# Patient Record
Sex: Female | Born: 1965
Health system: Southern US, Community
[De-identification: ages and names within clinical notes are randomized; demographics above are authoritative.]

## PROBLEM LIST (undated history)

## (undated) DIAGNOSIS — M1990 Unspecified osteoarthritis, unspecified site: Secondary | ICD-10-CM

## (undated) DIAGNOSIS — B019 Varicella without complication: Secondary | ICD-10-CM

## (undated) DIAGNOSIS — K219 Gastro-esophageal reflux disease without esophagitis: Secondary | ICD-10-CM

## (undated) DIAGNOSIS — E559 Vitamin D deficiency, unspecified: Secondary | ICD-10-CM

## (undated) HISTORY — DX: Varicella without complication: B01.9

## (undated) HISTORY — PX: ABDOMINAL HYSTERECTOMY: SHX81

## (undated) HISTORY — DX: Gastro-esophageal reflux disease without esophagitis: K21.9

## (undated) HISTORY — DX: Vitamin D deficiency, unspecified: E55.9

## (undated) HISTORY — PX: TUBAL LIGATION: SHX77

---

## 2004-10-29 ENCOUNTER — Ambulatory Visit: Payer: Self-pay | Admitting: General Practice

## 2005-03-13 ENCOUNTER — Ambulatory Visit: Payer: Self-pay | Admitting: Family Medicine

## 2005-12-03 ENCOUNTER — Ambulatory Visit: Payer: Self-pay | Admitting: General Practice

## 2007-02-02 ENCOUNTER — Ambulatory Visit: Payer: Self-pay | Admitting: Family Medicine

## 2009-01-02 ENCOUNTER — Ambulatory Visit: Payer: Self-pay | Admitting: Family Medicine

## 2009-10-28 ENCOUNTER — Emergency Department (HOSPITAL_COMMUNITY): Admission: EM | Admit: 2009-10-28 | Discharge: 2009-10-28 | Payer: Self-pay | Admitting: Emergency Medicine

## 2010-03-17 ENCOUNTER — Ambulatory Visit: Payer: Self-pay | Admitting: Family Medicine

## 2010-08-04 LAB — POCT PREGNANCY, URINE: Preg Test, Ur: NEGATIVE

## 2010-08-04 LAB — URINALYSIS, ROUTINE W REFLEX MICROSCOPIC
Nitrite: NEGATIVE
Protein, ur: NEGATIVE mg/dL
Urobilinogen, UA: 0.2 mg/dL (ref 0.0–1.0)

## 2011-04-28 ENCOUNTER — Ambulatory Visit: Payer: Self-pay | Admitting: Family Medicine

## 2013-06-26 ENCOUNTER — Ambulatory Visit: Payer: Self-pay | Admitting: Adult Health

## 2013-07-10 ENCOUNTER — Encounter: Payer: Self-pay | Admitting: Adult Health

## 2013-07-10 ENCOUNTER — Encounter (INDEPENDENT_AMBULATORY_CARE_PROVIDER_SITE_OTHER): Payer: Self-pay

## 2013-07-10 ENCOUNTER — Ambulatory Visit (INDEPENDENT_AMBULATORY_CARE_PROVIDER_SITE_OTHER): Payer: BC Managed Care – PPO | Admitting: Adult Health

## 2013-07-10 VITALS — BP 110/72 | HR 82 | Temp 98.3°F | Resp 14 | Ht 63.0 in | Wt 171.0 lb

## 2013-07-10 DIAGNOSIS — Z0000 Encounter for general adult medical examination without abnormal findings: Secondary | ICD-10-CM

## 2013-07-10 DIAGNOSIS — E559 Vitamin D deficiency, unspecified: Secondary | ICD-10-CM

## 2013-07-10 DIAGNOSIS — K5900 Constipation, unspecified: Secondary | ICD-10-CM

## 2013-07-10 DIAGNOSIS — Z1211 Encounter for screening for malignant neoplasm of colon: Secondary | ICD-10-CM

## 2013-07-10 DIAGNOSIS — Z1239 Encounter for other screening for malignant neoplasm of breast: Secondary | ICD-10-CM

## 2013-07-10 NOTE — Progress Notes (Signed)
Pre visit review using our clinic review tool, if applicable. No additional management support is needed unless otherwise documented below in the visit note. 

## 2013-07-10 NOTE — Patient Instructions (Signed)
   Thank you for choosing [place] at [facility] for your health care needs.  Please have your labs drawn at your earliest convenience. You will need to fast after midnight on the night before having your labs. You may drink water.  The results will be available through MyChart for your convenience. Please remember to activate this. The activation code is located at the end of this form.  I am referring you to Dr. Vira Agar for your screening colonoscopy. Their office will call you with an appointment.   Please call to schedule your Mammogram at [facility]. I have provided you with an order in the event you need it.  For constipation start Miralax daily. You can adjust the dose based on the frequency and consistency of your bowel movements.  I am requesting your records from Dr. Barbarann Ehlers office at [facility].

## 2013-07-10 NOTE — Progress Notes (Signed)
Patient ID: Pamela Nichols, female   DOB: Oct 09, 1965, 48 y.o.   MRN: [id number]    Subjective:    Patient ID: Pamela Nichols, female    DOB: [birth year], 48 y.o.   MRN: [id number]  HPI  Pt is a 48 y/o female who presents to clinic to establish care. She was previously followed by Dr. Kary Nichols at [facility]. Will request records. She is feeling well today. Has not had a physical exam in over 2 years. She is up to date on her Mammogram and PAP. She will need a screening colonoscopy this year as she turns 32 in April.    Past Medical History  Diagnosis Date  . Chicken pox   . Vitamin D deficiency      History reviewed. No pertinent past surgical history.   Family History  Problem Relation Age of Onset  . Hypertension Mother   . Mental illness Mother     Depression  . Cancer Father     prostate      History   Social History  . Marital Status: Married    Spouse Name: N/A    Number of Children: 2  . Years of Education: 14   Occupational History  . Hair Stylist    Social History Main Topics  . Smoking status: Never Smoker   . Smokeless tobacco: Not on file  . Alcohol Use: No  . Drug Use: No  . Sexual Activity: Not on file   Other Topics Concern  . Not on file   Social History Narrative   Pamela Nichols grew up in [place]. She is married and lives in [place] with her husband. She works as a Probation officer. She and her husband are raising their nephew that is 37 years old. She has 2 adult children from a previous marriage. Her children live in [place]. She enjoys being outdoors. She also enjoys relaxing and watching TV.     Review of Systems  Constitutional: Negative.   HENT: Negative.   Eyes: Negative.   Respiratory: Negative.   Cardiovascular: Negative.   Gastrointestinal: Negative.   Endocrine: Negative.   Genitourinary: Negative.   Musculoskeletal: Negative.   Skin: Negative.   Allergic/Immunologic: Negative.   Neurological: Negative.     Hematological: Negative.   Psychiatric/Behavioral: Negative.        Objective:  There were no vitals taken for this visit.   Physical Exam  Constitutional: She is oriented to person, place, and time. She appears well-developed and well-nourished. No distress.  HENT:  Head: Normocephalic and atraumatic.  Right Ear: External ear normal.  Left Ear: External ear normal.  Nose: Nose normal.  Mouth/Throat: Oropharynx is clear and moist.  Eyes: Conjunctivae and EOM are normal. Pupils are equal, round, and reactive to light.  Neck: Normal range of motion. Neck supple. No tracheal deviation present. No thyromegaly present.  Cardiovascular: Normal rate, regular rhythm, normal heart sounds and intact distal pulses.  Exam reveals no gallop and no friction rub.   No murmur heard. Pulmonary/Chest: Effort normal and breath sounds normal. No respiratory distress. She has no wheezes. She has no rales.  Abdominal: Soft. Bowel sounds are normal. She exhibits no distension and no mass. There is no tenderness. There is no rebound and no guarding.  Musculoskeletal: Normal range of motion. She exhibits no edema and no tenderness.  Lymphadenopathy:    She has no cervical adenopathy.  Neurological: She is alert and oriented to person, place, and time. She has normal reflexes. No  cranial nerve deficit. Coordination normal.  Skin: Skin is warm and dry.  Psychiatric: She has a normal mood and affect. Her behavior is normal. Judgment and thought content normal.       Assessment & Plan:   1. Routine general medical examination at a health care facility Normal physical exam excluding breast, pelvic/PAP which were recently done. Will order screening mammogram. Pt will need screening colonoscopy this year. Check labs. Request records from previous PCP.  - TSH; Future - CBC with Differential; Future - Lipid panel; Future - Vitamin B12; Future - Comprehensive metabolic panel; Future  2. Vitamin D  deficiency Hx of vitamin D deficiency previously on Drisdol. She reports she has not had this rechecked and she is currently not taking any medications.  - Vit D  25 hydroxy (rtn osteoporosis monitoring); Future  3. Screening for breast cancer Ordered screening Mammogram. She will call [facility] to self-schedule.  - MM DIGITAL SCREENING BILATERAL; Future  4. Screening for colon cancer Refer to Dr. Vira Nichols from screening colonoscopy.  - Ambulatory referral to Gastroenterology  5. Constipation Has problems with constipations. Sometimes goes days without adequate bm. Start miralax daily. Adjust dose as needed based on frequency and consistency of BMs.

## 2013-07-17 ENCOUNTER — Other Ambulatory Visit: Payer: BC Managed Care – PPO

## 2013-07-18 ENCOUNTER — Other Ambulatory Visit: Payer: Self-pay | Admitting: Adult Health

## 2013-07-18 ENCOUNTER — Other Ambulatory Visit (INDEPENDENT_AMBULATORY_CARE_PROVIDER_SITE_OTHER): Payer: BC Managed Care – PPO

## 2013-07-18 DIAGNOSIS — Z0000 Encounter for general adult medical examination without abnormal findings: Secondary | ICD-10-CM

## 2013-07-18 DIAGNOSIS — D649 Anemia, unspecified: Secondary | ICD-10-CM

## 2013-07-18 DIAGNOSIS — E559 Vitamin D deficiency, unspecified: Secondary | ICD-10-CM

## 2013-07-18 LAB — CBC WITH DIFFERENTIAL/PLATELET
BASOS ABS: 0 10*3/uL (ref 0.0–0.1)
BASOS PCT: 0.1 % (ref 0.0–3.0)
EOS PCT: 2.7 % (ref 0.0–5.0)
Eosinophils Absolute: 0.1 10*3/uL (ref 0.0–0.7)
HEMATOCRIT: 29.9 % — AB (ref 36.0–46.0)
HEMOGLOBIN: 9.4 g/dL — AB (ref 12.0–15.0)
LYMPHS PCT: 47.9 % — AB (ref 12.0–46.0)
Lymphs Abs: 2 10*3/uL (ref 0.7–4.0)
MCHC: 31.4 g/dL (ref 30.0–36.0)
MCV: 76.1 fl — ABNORMAL LOW (ref 78.0–100.0)
MONOS PCT: 11.7 % (ref 3.0–12.0)
Monocytes Absolute: 0.5 10*3/uL (ref 0.1–1.0)
NEUTROS ABS: 1.6 10*3/uL (ref 1.4–7.7)
Neutrophils Relative %: 37.6 % — ABNORMAL LOW (ref 43.0–77.0)
Platelets: 245 10*3/uL (ref 150.0–400.0)
RBC: 3.93 Mil/uL (ref 3.87–5.11)
RDW: 20.3 % — AB (ref 11.5–14.6)
WBC: 4.2 10*3/uL — AB (ref 4.5–10.5)

## 2013-07-18 LAB — COMPREHENSIVE METABOLIC PANEL
ALK PHOS: 53 U/L (ref 39–117)
ALT: 11 U/L (ref 0–35)
AST: 18 U/L (ref 0–37)
Albumin: 3.6 g/dL (ref 3.5–5.2)
BILIRUBIN TOTAL: 0.9 mg/dL (ref 0.3–1.2)
BUN: 8 mg/dL (ref 6–23)
CO2: 25 meq/L (ref 19–32)
CREATININE: 0.6 mg/dL (ref 0.4–1.2)
Calcium: 8.5 mg/dL (ref 8.4–10.5)
Chloride: 107 mEq/L (ref 96–112)
GFR: 131.08 mL/min (ref >60.00)
Glucose, Bld: 81 mg/dL (ref 70–99)
Potassium: 3.8 mEq/L (ref 3.5–5.1)
Sodium: 136 mEq/L (ref 135–145)
Total Protein: 7 g/dL (ref 6.0–8.3)

## 2013-07-18 LAB — LIPID PANEL
Cholesterol: 167 mg/dL (ref 0–200)
HDL: 62.8 mg/dL (ref >39.00)
LDL CALC: 92 mg/dL (ref 0–99)
Total CHOL/HDL Ratio: 3
Triglycerides: 62 mg/dL (ref 0.0–149.0)
VLDL: 12.4 mg/dL (ref 0.0–40.0)

## 2013-07-18 LAB — TSH: TSH: 1.17 u[IU]/mL (ref 0.35–5.50)

## 2013-07-18 LAB — VITAMIN B12: VITAMIN B 12: 520 pg/mL (ref 211–911)

## 2013-07-19 LAB — VITAMIN D 25 HYDROXY (VIT D DEFICIENCY, FRACTURES): VIT D 25 HYDROXY: 26 ng/mL — AB (ref 30–89)

## 2013-07-20 ENCOUNTER — Telehealth: Payer: Self-pay | Admitting: *Deleted

## 2013-07-20 NOTE — Telephone Encounter (Signed)
Message copied by Vernetta Honey on Thu Jul 20, 2013  1:20 PM ------      Message from: Sherryl Barters      Created: Tue Jul 18, 2013  1:51 PM       Labs show anemia. I want her to come in for additional blood work to check her iron level ------

## 2013-07-20 NOTE — Telephone Encounter (Signed)
Advised pt of [facility] note "Labs show anemia. I want her to come in for additional blood work to check her iron level" .  Pt agreed and is making a lab appt

## 2013-07-24 ENCOUNTER — Ambulatory Visit: Payer: Self-pay | Admitting: Adult Health

## 2013-07-24 ENCOUNTER — Other Ambulatory Visit (INDEPENDENT_AMBULATORY_CARE_PROVIDER_SITE_OTHER): Payer: BC Managed Care – PPO

## 2013-07-24 DIAGNOSIS — D649 Anemia, unspecified: Secondary | ICD-10-CM

## 2013-07-24 LAB — FERRITIN: FERRITIN: 4.8 ng/mL — AB (ref 10.0–291.0)

## 2013-07-24 LAB — FOLATE: FOLATE: 9.5 ng/mL (ref >5.9)

## 2013-07-24 LAB — IRON: Iron: 12 ug/dL — ABNORMAL LOW (ref 42–145)

## 2013-07-25 ENCOUNTER — Other Ambulatory Visit: Payer: BC Managed Care – PPO

## 2013-07-25 ENCOUNTER — Other Ambulatory Visit: Payer: Self-pay | Admitting: Adult Health

## 2013-07-25 MED ORDER — FERROUS SULFATE 325 (65 FE) MG PO TABS: 325.0000 mg | ORAL_TABLET | Freq: Two times a day (BID) | ORAL | Status: DC

## 2013-07-25 NOTE — Progress Notes (Signed)
Notified pt. 

## 2013-08-11 ENCOUNTER — Encounter: Payer: Self-pay | Admitting: Adult Health

## 2013-09-25 ENCOUNTER — Emergency Department (HOSPITAL_COMMUNITY): Payer: BC Managed Care – PPO

## 2013-09-25 ENCOUNTER — Encounter (HOSPITAL_COMMUNITY): Payer: Self-pay | Admitting: Emergency Medicine

## 2013-09-25 ENCOUNTER — Emergency Department (HOSPITAL_COMMUNITY)
Admission: EM | Admit: 2013-09-25 | Discharge: 2013-09-25 | Disposition: A | Discharge status: Home / Self Care | Payer: BC Managed Care – PPO | Attending: Emergency Medicine | Admitting: Emergency Medicine

## 2013-09-25 DIAGNOSIS — R1011 Right upper quadrant pain: Secondary | ICD-10-CM | POA: Diagnosis not present

## 2013-09-25 DIAGNOSIS — Z79899 Other long term (current) drug therapy: Secondary | ICD-10-CM | POA: Diagnosis not present

## 2013-09-25 DIAGNOSIS — Z9851 Tubal ligation status: Secondary | ICD-10-CM | POA: Diagnosis not present

## 2013-09-25 DIAGNOSIS — R109 Unspecified abdominal pain: Secondary | ICD-10-CM

## 2013-09-25 DIAGNOSIS — R079 Chest pain, unspecified: Secondary | ICD-10-CM | POA: Diagnosis present

## 2013-09-25 DIAGNOSIS — R110 Nausea: Secondary | ICD-10-CM | POA: Diagnosis not present

## 2013-09-25 DIAGNOSIS — Z8619 Personal history of other infectious and parasitic diseases: Secondary | ICD-10-CM | POA: Insufficient documentation

## 2013-09-25 LAB — CBC WITH DIFFERENTIAL/PLATELET
BASOS ABS: 0 10*3/uL (ref 0.0–0.1)
Basophils Relative: 0 % (ref 0–1)
Eosinophils Absolute: 0.2 10*3/uL (ref 0.0–0.7)
Eosinophils Relative: 3 % (ref 0–5)
HCT: 33.3 % — ABNORMAL LOW (ref 36.0–46.0)
Hemoglobin: 10.9 g/dL — ABNORMAL LOW (ref 12.0–15.0)
LYMPHS PCT: 37 % (ref 12–46)
Lymphs Abs: 2.3 10*3/uL (ref 0.7–4.0)
MCH: 26.1 pg (ref 26.0–34.0)
MCHC: 32.7 g/dL (ref 30.0–36.0)
MCV: 79.7 fL (ref 78.0–100.0)
Monocytes Absolute: 0.7 10*3/uL (ref 0.1–1.0)
Monocytes Relative: 12 % (ref 3–12)
NEUTROS ABS: 3 10*3/uL (ref 1.7–7.7)
Neutrophils Relative %: 48 % (ref 43–77)
PLATELETS: 253 10*3/uL (ref 150–400)
RBC: 4.18 MIL/uL (ref 3.87–5.11)
RDW: 18.4 % — AB (ref 11.5–15.5)
WBC: 6.2 10*3/uL (ref 4.0–10.5)

## 2013-09-25 LAB — COMPREHENSIVE METABOLIC PANEL
ALT: 12 U/L (ref 0–35)
AST: 15 U/L (ref 0–37)
Albumin: 3.6 g/dL (ref 3.5–5.2)
Alkaline Phosphatase: 68 U/L (ref 39–117)
BILIRUBIN TOTAL: 0.2 mg/dL — AB (ref 0.3–1.2)
BUN: 9 mg/dL (ref 6–23)
CHLORIDE: 102 meq/L (ref 96–112)
CO2: 24 meq/L (ref 19–32)
Calcium: 9.2 mg/dL (ref 8.4–10.5)
Creatinine, Ser: 0.61 mg/dL (ref 0.50–1.10)
GFR calc Af Amer: >90 mL/min (ref >90)
Glucose, Bld: 102 mg/dL — ABNORMAL HIGH (ref 70–99)
POTASSIUM: 3.9 meq/L (ref 3.7–5.3)
SODIUM: 138 meq/L (ref 137–147)
Total Protein: 7.5 g/dL (ref 6.0–8.3)

## 2013-09-25 LAB — LIPASE, BLOOD: Lipase: 86 U/L — ABNORMAL HIGH (ref 11–59)

## 2013-09-25 LAB — TROPONIN I: Troponin I: <0.3 ng/mL (ref <0.30)

## 2013-09-25 MED ORDER — OXYCODONE-ACETAMINOPHEN 5-325 MG PO TABS: 1.0000 | ORAL_TABLET | ORAL | Status: DC | PRN

## 2013-09-25 MED ORDER — MORPHINE SULFATE 4 MG/ML IJ SOLN
4.0000 mg | Freq: Once | INTRAMUSCULAR | Status: AC
  Administered 2013-09-25: 4 mg via INTRAVENOUS
  Filled 2013-09-25: qty 1

## 2013-09-25 MED ORDER — PROMETHAZINE HCL 25 MG PO TABS: 25.0000 mg | ORAL_TABLET | Freq: Four times a day (QID) | ORAL | Status: DC | PRN

## 2013-09-25 MED ORDER — SODIUM CHLORIDE 0.9 % IV BOLUS (SEPSIS)
1000.0000 mL | Freq: Once | INTRAVENOUS | Status: AC
  Administered 2013-09-25: 1000 mL via INTRAVENOUS

## 2013-09-25 MED ORDER — ONDANSETRON HCL 4 MG/2ML IJ SOLN
4.0000 mg | Freq: Once | INTRAMUSCULAR | Status: AC
  Administered 2013-09-25: 4 mg via INTRAVENOUS
  Filled 2013-09-25: qty 2

## 2013-09-25 NOTE — ED Notes (Addendum)
Abd pain  Chest pain, n/v, no diarrhea.  Feels food not going down.   No urinary sx,

## 2013-09-25 NOTE — ED Provider Notes (Signed)
CSN: [id number]     Arrival date & time 09/25/13  1450 History  This chart was scribed for Nat Christen, MD by Jenne Campus, ED Scribe. This patient was seen in room APA11/APA11 and the patient's care was started at 3:35 PM.    Chief Complaint  Patient presents with  . Chest Pain      The history is provided by the patient. No language interpreter was used.    HPI Comments: Pamela Nichols is a 48 y.o. female who presents to the Emergency Department complaining of a new onset of constant RUQ abdominal pain for the past week with associated nausea. Pt reports that he symptoms are made worse with eating and states that it feels as if the "food is not going down". She denies any family h/o cholelithiasis. No radiation of pain. Severity is mild to moderate.  She denies any chronic medical conditions other than Vitamin D deficiency.   Past Medical History  Diagnosis Date  . Chicken pox   . Vitamin D deficiency    Past Surgical History  Procedure Laterality Date  . Tubal ligation     Family History  Problem Relation Age of Onset  . Hypertension Mother   . Mental illness Mother     Depression  . Cancer Father     prostate    History  Substance Use Topics  . Smoking status: Never Smoker   . Smokeless tobacco: Not on file  . Alcohol Use: No   No OB history provided.  Review of Systems  A complete 10 system review of systems was obtained and all systems are negative except as noted in the HPI and PMH.    Allergies  Review of patient's allergies indicates no known allergies.  Home Medications   Prior to Admission medications   Medication Sig Start Date End Date Taking? Authorizing Provider  ferrous sulfate 325 (65 FE) MG tablet Take 1 tablet (325 mg total) by mouth 2 (two) times daily. 07/25/13   Charolette Forward, NP   Triage vitals: BP 129/85  Pulse 75  Temp(Src) 98.7 F (37.1 C) (Oral)  Resp 18  Ht 5\' 2"  (1.575 m)  Wt 171 lb (77.565 kg)  BMI 31.27 kg/m2  SpO2 100%   LMP 09/04/2013  Physical Exam  Nursing note and vitals reviewed. Constitutional: She is oriented to person, place, and time. She appears well-developed and well-nourished.  HENT:  Head: Normocephalic and atraumatic.  Eyes: Conjunctivae and EOM are normal. Pupils are equal, round, and reactive to light.  Neck: Normal range of motion. Neck supple.  Cardiovascular: Normal rate, regular rhythm and normal heart sounds.   Pulmonary/Chest: Effort normal and breath sounds normal.  Abdominal: Soft. Bowel sounds are normal. There is tenderness (moderate RUQ tenderness ).  Musculoskeletal: Normal range of motion.  Neurological: She is alert and oriented to person, place, and time.  Skin: Skin is warm and dry.  Psychiatric: She has a normal mood and affect. Her behavior is normal.    ED Course  Procedures (including critical care time)  DIAGNOSTIC STUDIES: Oxygen Saturation is 100% on RA, normal by my interpretation.    COORDINATION OF CARE: 3:37 PM-Discussed treatment plan which includes CXR, CBC panel, CMP and a troponin with pt at bedside and pt agreed to plan.   Labs Review Labs Reviewed  CBC WITH DIFFERENTIAL - Abnormal; Notable for the following:    Hemoglobin 10.9 (*)    HCT 33.3 (*)    RDW 18.4 (*)  All other components within normal limits  COMPREHENSIVE METABOLIC PANEL - Abnormal; Notable for the following:    Glucose, Bld 102 (*)    Total Bilirubin 0.2 (*)    All other components within normal limits  LIPASE, BLOOD - Abnormal; Notable for the following:    Lipase 86 (*)    All other components within normal limits  TROPONIN I    Imaging Review Dg Chest 2 View  09/25/2013   CLINICAL DATA:  Epigastric pain.  EXAM: CHEST  2 VIEW  COMPARISON:  None.  FINDINGS: The lungs are borderline hypoinflated especially on the frontal film. There is no focal infiltrate. There is no pleural effusion or pneumothorax. The cardiopericardial silhouette is normal in size. The pulmonary  vascularity is not engorged. The mediastinum is normal in width. There is a moderate amount of gas and stool within bowel in the upper abdomen. There is gas within the stomach. The observed portions of the bony thorax exhibit no acute abnormalities.  IMPRESSION: 1. There is no evidence of active cardiopulmonary disease. 2. There is a moderate amount of gas and stool within bowel in the upper abdomen. No free subdiaphragmatic gas collections are demonstrated.   Electronically Signed   By: David  Martinique   On: 09/25/2013 16:07   US Abdomen Limited Ruq  09/25/2013   CLINICAL DATA:  Right upper quadrant discomfort with nausea and vomiting  EXAM: US ABDOMEN LIMITED - RIGHT UPPER QUADRANT  COMPARISON:  None.  FINDINGS: Gallbladder:  No gallstones or wall thickening visualized. No sonographic Murphy sign noted.  Common bile duct:  Diameter: 3.5 mm.  Liver:  No focal lesion identified. Within normal limits in parenchymal echogenicity. No intrahepatic ductal dilation is demonstrated.  IMPRESSION: No acute abnormality of the gallbladder or common bile duct or liver is demonstrated. Bowel gas limits the study somewhat.   Electronically Signed   By: David  Martinique   On: 09/25/2013 17:22     EKG Interpretation   Date/Time:  Monday Sep 25 2013 15:35:29 EDT Ventricular Rate:  77 PR Interval:  134 QRS Duration: 70 QT Interval:  373 QTC Calculation: 422 R Axis:   41 Text Interpretation:  Sinus rhythm Low voltage, precordial leads Confirmed  by Devarion Mcclanahan  MD, Talena Neira ([id number]) on 09/25/2013 4:42:26 PM      MDM   Final diagnoses:  Abdominal pain   No acute abdomen. Lipase is minimally elevated. Ultrasound shows no obvious gallbladder or common bile duct problems.  Discharge medications Percocet and Phenergan 25 mg. Patient will return if worse. I personally performed the services described in this documentation, which was scribed in my presence. The recorded information has been reviewed and is  accurate.      Nat Christen, MD 09/25/13 [phone]

## 2013-09-25 NOTE — Discharge Instructions (Signed)
Abdominal Pain, Adult Many things can cause belly (abdominal) pain. Most times, the belly pain is not dangerous. Many cases of belly pain can be watched and treated at home. HOME CARE   Do not take medicines that help you go poop (laxatives) unless told to by your doctor.  Only take medicine as told by your doctor.  Eat or drink as told by your doctor. Your doctor will tell you if you should be on a special diet. GET HELP IF:  You do not know what is causing your belly pain.  You have belly pain while you are sick to your stomach (nauseous) or have runny poop (diarrhea).  You have pain while you pee or poop.  Your belly pain wakes you up at night.  You have belly pain that gets worse or better when you eat.  You have belly pain that gets worse when you eat fatty foods. GET HELP RIGHT AWAY IF:   The pain does not go away within 2 hours.  You have a fever.  You keep throwing up (vomiting).  The pain changes and is only in the right or left part of the belly.  You have bloody or tarry looking poop. MAKE SURE YOU:   Understand these instructions.  Will watch your condition.  Will get help right away if you are not doing well or get worse. Document Released: 10/21/2007 Document Revised: 02/22/2013 Document Reviewed: 01/11/2013 [facility] Patient Information 2014 [place].  A chemical called lipase is slightly elevated. This is associated with your pancreas. Medication for pain and nausea. Avoid rich greasy foods. Return to emergency Department if feeling worse in any way.

## 2013-09-29 ENCOUNTER — Telehealth: Payer: Self-pay | Admitting: Adult Health

## 2013-10-03 ENCOUNTER — Ambulatory Visit: Payer: BC Managed Care – PPO | Admitting: Adult Health

## 2013-10-16 ENCOUNTER — Ambulatory Visit: Payer: BC Managed Care – PPO | Admitting: Adult Health

## 2013-10-18 ENCOUNTER — Ambulatory Visit: Payer: BC Managed Care – PPO | Admitting: Adult Health

## 2013-10-23 ENCOUNTER — Ambulatory Visit (INDEPENDENT_AMBULATORY_CARE_PROVIDER_SITE_OTHER): Payer: BC Managed Care – PPO | Admitting: Adult Health

## 2013-10-23 ENCOUNTER — Encounter: Payer: Self-pay | Admitting: Adult Health

## 2013-10-23 VITALS — BP 110/76 | HR 90 | Temp 98.0°F | Resp 14 | Wt 175.5 lb

## 2013-10-23 DIAGNOSIS — K5900 Constipation, unspecified: Secondary | ICD-10-CM

## 2013-10-23 DIAGNOSIS — R6889 Other general symptoms and signs: Secondary | ICD-10-CM

## 2013-10-23 DIAGNOSIS — R1013 Epigastric pain: Secondary | ICD-10-CM

## 2013-10-23 DIAGNOSIS — L989 Disorder of the skin and subcutaneous tissue, unspecified: Secondary | ICD-10-CM | POA: Insufficient documentation

## 2013-10-23 DIAGNOSIS — R899 Unspecified abnormal finding in specimens from other organs, systems and tissues: Secondary | ICD-10-CM | POA: Insufficient documentation

## 2013-10-23 MED ORDER — TRIAMCINOLONE ACETONIDE 0.1 % EX CREA: 1.0000 "application " | TOPICAL_CREAM | Freq: Two times a day (BID) | CUTANEOUS | Status: DC

## 2013-10-23 NOTE — Progress Notes (Signed)
Patient ID: Pamela Nichols, female   DOB: 1966/04/15, 48 y.o.   MRN: [id number]   Subjective:    Patient ID: Pamela Nichols, female    DOB: 05-26-1965, 48 y.o.   MRN: [id number]  HPI  Pt is a pleasant 48 y/o female who presents to clinic for follow up ED visit on 09/25/13 for epigastric pain. She reports that she had been drinking Herbal life supplements when she began to experience significant abdominal discomfort. She had abdominal ultrasound which was negative. Chest xray was normal. Lipase was slightly elevated. Pt does not drink. She has been feeling tired and fatigued. Her ferritin levels were very low. I had asked her to start ferrous sulfate but she reports she has not been consistent with this. She has been having problems with constipation.   Pt reports having an area behind her left ear that she thinks she may have a tick embedded. She applies alcohol to the area and reports burning. The area is itchy. She has had this for several weeks.   Past Medical History  Diagnosis Date  . Chicken pox   . Vitamin D deficiency      Past Surgical History  Procedure Laterality Date  . Tubal ligation       Family History  Problem Relation Age of Onset  . Hypertension Mother   . Mental illness Mother     Depression  . Cancer Father     prostate      History   Social History  . Marital Status: Married    Spouse Name: N/A    Number of Children: 2  . Years of Education: 14   Occupational History  . Hair Stylist    Social History Main Topics  . Smoking status: Never Smoker   . Smokeless tobacco: Not on file  . Alcohol Use: No  . Drug Use: No  . Sexual Activity: Not on file   Other Topics Concern  . Not on file   Social History Narrative   Pamela Nichols grew up in [place]. She is married and lives in [place] with her husband. 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 She has 2 adult children from a  previous marriage. Her children live in [place]. She enjoys being outdoors. She also enjoys relaxing and watching TV.     Current Outpatient Prescriptions on File Prior to Visit  Medication Sig Dispense Refill  . Cholecalciferol (VITAMIN D) 2000 UNITS CAPS Take 1 capsule by mouth daily.       . naproxen sodium (ALEVE) 220 MG tablet Take 440 mg by mouth daily as needed (for pain).       No current facility-administered medications on file prior to visit.     Review of Systems  Constitutional: Positive for fatigue. Negative for activity change and appetite change.  Respiratory: Negative for cough, chest tightness and shortness of breath.   Cardiovascular: Negative for chest pain and palpitations.  Gastrointestinal: Positive for constipation. Negative for nausea, abdominal pain, diarrhea and blood in stool.  Genitourinary: Negative.   Musculoskeletal: Negative.   Skin:       Itchy area behind left ear  Neurological: Negative.   Psychiatric/Behavioral: Negative.   All other systems reviewed and are negative.      Objective:  BP 110/76  Pulse 90  Temp(Src) 98 F (36.7 C) (Oral)  Resp 14  Wt 175 lb 8 oz (79.606 kg)  SpO2 99%  LMP 09/04/2013   Physical Exam  Constitutional: She is oriented to person, place, and time. She appears well-developed and well-nourished. No distress.  HENT:  Head: Normocephalic and atraumatic.  Eyes: Conjunctivae and EOM are normal.  Neck: Normal range of motion. Neck supple.  Cardiovascular: Normal rate and regular rhythm.   Pulmonary/Chest: Effort normal. No respiratory distress.  Abdominal: Soft. Bowel sounds are normal. She exhibits no distension and no mass. There is no tenderness. There is no rebound and no guarding.  Musculoskeletal: Normal range of motion.  Neurological: She is alert and oriented to person, place, and time. She has normal reflexes. Coordination normal.  Skin:  Small ulceration behind her left ear. No drainage. There is  some crusting of the area.  Psychiatric: She has a normal mood and affect. Her behavior is normal. Judgment and thought content normal.      Assessment & Plan:   1. Abdominal pain, epigastric Follow up visit for epigastric pain which she went to the ED for on 09/25/13. She had been drinking supplements (Herbalife). Once she stopped drinking this her symptoms improved.   2. Constipation Start miralax twice a day for 5 days then once daily. She was advised that she could adjust dose based on her bowel movements. Pt is going to take iron which is constipating and she already has problems with constipation  3. Skin lesion Lesion behind the left ear. Secondary lesion due to manipulation. Apply triamcinolone cream twice daily for 1 week. If no improvement will refer to Derm.  4. Abnormal laboratory test result Abnormal labs during ED visit. Will check these levels to see if they are improved. - Hepatic function panel - Basic Metabolic Panel (BMET) - Lipase

## 2013-10-23 NOTE — Patient Instructions (Addendum)
  Vitamin D3 - 2000 units daily  Ferrous sulfate 325 mg twice a day with food. No milk or milk products.  Miralax twice a day for 5 days for constipation. You can start taking daily once your bowels begin to move.  Triamcinolone cream twice daily for 1 week. Call me if the area does not heal.

## 2013-10-23 NOTE — Progress Notes (Signed)
Pre visit review using our clinic review tool, if applicable. No additional management support is needed unless otherwise documented below in the visit note. 

## 2013-10-24 LAB — HEPATIC FUNCTION PANEL
ALT: 9 U/L (ref 0–35)
AST: 14 U/L (ref 0–37)
Albumin: 3.8 g/dL (ref 3.5–5.2)
Alkaline Phosphatase: 58 U/L (ref 39–117)
Bilirubin, Direct: 0.1 mg/dL (ref 0.0–0.3)
Total Bilirubin: 0.5 mg/dL (ref 0.2–1.2)
Total Protein: 7 g/dL (ref 6.0–8.3)

## 2013-10-24 LAB — BASIC METABOLIC PANEL WITH GFR
BUN: 9 mg/dL (ref 6–23)
CO2: 25 meq/L (ref 19–32)
Calcium: 8.9 mg/dL (ref 8.4–10.5)
Chloride: 105 meq/L (ref 96–112)
Creatinine, Ser: 0.6 mg/dL (ref 0.4–1.2)
GFR: 135.99 mL/min (ref >60.00)
Glucose, Bld: 77 mg/dL (ref 70–99)
Potassium: 3.5 meq/L (ref 3.5–5.1)
Sodium: 138 meq/L (ref 135–145)

## 2013-10-24 LAB — LIPASE: Lipase: 33 U/L (ref 11.0–59.0)

## 2014-04-02 ENCOUNTER — Ambulatory Visit: Payer: BC Managed Care – PPO | Admitting: Nurse Practitioner

## 2014-04-02 ENCOUNTER — Ambulatory Visit: Payer: BC Managed Care – PPO | Admitting: Internal Medicine

## 2014-04-09 ENCOUNTER — Encounter: Payer: Self-pay | Admitting: Nurse Practitioner

## 2014-04-09 ENCOUNTER — Ambulatory Visit (INDEPENDENT_AMBULATORY_CARE_PROVIDER_SITE_OTHER): Payer: BC Managed Care – PPO | Admitting: Nurse Practitioner

## 2014-04-09 ENCOUNTER — Encounter (INDEPENDENT_AMBULATORY_CARE_PROVIDER_SITE_OTHER): Payer: Self-pay

## 2014-04-09 VITALS — BP 112/74 | HR 86 | Temp 98.4°F | Resp 14 | Wt 178.0 lb

## 2014-04-09 DIAGNOSIS — M25511 Pain in right shoulder: Secondary | ICD-10-CM | POA: Insufficient documentation

## 2014-04-09 DIAGNOSIS — K5900 Constipation, unspecified: Secondary | ICD-10-CM

## 2014-04-09 NOTE — Patient Instructions (Signed)
We will call you for orthopedic appointment.  Please take a probiotic (Align, FloraQue or Culturelle). Store brand is cheaper and just as effective. Please get appointment for annual wellness.  Nice to meet you!   Shoulder Pain The shoulder is the joint that connects your arms to your body. The bones that form the shoulder joint include the upper arm bone (humerus), the shoulder blade (scapula), and the collarbone (clavicle). The top of the humerus is shaped like a ball and fits into a rather flat socket on the scapula (glenoid cavity). A combination of muscles and strong, fibrous tissues that connect muscles to bones (tendons) support your shoulder joint and hold the ball in the socket. Small, fluid-filled sacs (bursae) are located in different areas of the joint. They act as cushions between the bones and the overlying soft tissues and help reduce friction between the gliding tendons and the bone as you move your arm. Your shoulder joint allows a wide range of motion in your arm. This range of motion allows you to do things like scratch your back or throw a ball. However, this range of motion also makes your shoulder more prone to pain from overuse and injury. Causes of shoulder pain can originate from both injury and overuse and usually can be grouped in the following four categories:  Redness, swelling, and pain (inflammation) of the tendon (tendinitis) or the bursae (bursitis).  Instability, such as a dislocation of the joint.  Inflammation of the joint (arthritis).  Broken bone (fracture). HOME CARE INSTRUCTIONS   Apply ice to the sore area.  Put ice in a plastic bag.  Place a towel between your skin and the bag.  Leave the ice on for 15-20 minutes, 3-4 times per day for the first 2 days, or as directed by your health care provider.  Stop using cold packs if they do not help with the pain.  If you have a shoulder sling or immobilizer, wear it as long as your caregiver instructs.  Only remove it to shower or bathe. Move your arm as little as possible, but keep your hand moving to prevent swelling.  Squeeze a soft ball or foam pad as much as possible to help prevent swelling.  Only take over-the-counter or prescription medicines for pain, discomfort, or fever as directed by your caregiver. SEEK MEDICAL CARE IF:   Your shoulder pain increases, or new pain develops in your arm, hand, or fingers.  Your hand or fingers become cold and numb.  Your pain is not relieved with medicines. SEEK IMMEDIATE MEDICAL CARE IF:   Your arm, hand, or fingers are numb or tingling.  Your arm, hand, or fingers are significantly swollen or turn white or blue. MAKE SURE YOU:   Understand these instructions.  Will watch your condition.  Will get help right away if you are not doing well or get worse. Document Released: 02/11/2005 Document Revised: 09/18/2013 Document Reviewed: 04/18/2011 [facility] Patient Information 2015 [place], [place]. This information is not intended to replace advice given to you by your health care provider. Make sure you discuss any questions you have with your health care provider.

## 2014-04-09 NOTE — Progress Notes (Signed)
Subjective:    Patient ID: Pamela Nichols, female    DOB: 24-Aug-1965, 48 y.o.   MRN: [id number]  HPI  Pamela Nichols is a 48 yo female here for shoulder pain.   1) Right shoulder pain. Onset 1 month ago. Denies trauma. Went on cruise in October she noticed it when she came back and worked a week as a Arts administrator. Right hand dominant. Aleve or Bayer for body/back both are helpful, takes everyday. Denies having trouble with putting on bra. Worse 9/10, Best 3/10, Aching/sharp pain described when moved  Worse- laying on right shoulder, and lifting it, using a hairbrush, lifting it above head Alleviating- Helpful when off of work, medications, not used heating pad/ice  2) Constipation- feels bloated, hard in lower abdomen,   Diet- Eats whatever she wants.   Exercise- walks on weekends   Last BM- Saturday- Green tea helped her went, "normal" formed stool.   Drinks water often pt reports.   LMP: 03/11/14    Review of Systems  Constitutional: Negative for fever.  Gastrointestinal: Positive for constipation and abdominal distention. Negative for nausea, vomiting, abdominal pain, diarrhea and blood in stool.  Musculoskeletal: Positive for arthralgias. Negative for myalgias, back pain, joint swelling, neck pain and neck stiffness.   Past Medical History  Diagnosis Date  . Chicken pox   . Vitamin D deficiency     History   Social History  . Marital Status: Married    Spouse Name: N/A    Number of Children: 2  . Years of Education: 14   Occupational History  . Hair Stylist    Social History Main Topics  . Smoking status: Never Smoker   . Smokeless tobacco: Not on file  . Alcohol Use: No  . Drug Use: No  . Sexual Activity: Not on file   Other Topics Concern  . Not on file   Social History Narrative   Pamela Nichols grew up in [place]. She is married and lives in [place] with her husband. She works as a Probation officer. She and her husband are raising their nephew that is  49 years old. She has 2 adult children from a previous marriage. Her children live in [place]. She enjoys being outdoors. She also enjoys relaxing and watching TV.    Past Surgical History  Procedure Laterality Date  . Tubal ligation      Family History  Problem Relation Age of Onset  . Hypertension Mother   . Mental illness Mother     Depression  . Cancer Father     prostate     No Known Allergies  Current Outpatient Prescriptions on File Prior to Visit  Medication Sig Dispense Refill  . Cholecalciferol (VITAMIN D) 2000 UNITS CAPS Take 1 capsule by mouth daily.     . ferrous sulfate 325 (65 FE) MG tablet Take 325 mg by mouth 2 (two) times daily with a meal.    . naproxen sodium (ALEVE) 220 MG tablet Take 440 mg by mouth daily as needed (for pain).    . triamcinolone cream (KENALOG) 0.1 % Apply 1 application topically 2 (two) times daily. 30 g 0   No current facility-administered medications on file prior to visit.    BP 112/74 mmHg  Pulse 86  Temp(Src) 98.4 F (36.9 C) (Oral)  Resp 14  Wt 178 lb (80.74 kg)  SpO2 97%      Objective:   Physical Exam  Constitutional: She is oriented to person, place, and time.  Neck: Normal range of motion. Neck supple.  Abdominal: Soft. Bowel sounds are normal. She exhibits no distension and no mass. There is no tenderness. There is no rebound and no guarding.  Musculoskeletal: She exhibits no edema or tenderness.  Limited ROM in right shoulder, Empty can test positive, Drop arm negative. Left shoulder normal exam  Neurological: She is alert and oriented to person, place, and time.  Skin: Skin is warm and dry. No rash noted.  Psychiatric: She has a normal mood and affect. Her behavior is normal. Judgment and thought content normal.       Assessment & Plan:

## 2014-04-09 NOTE — Progress Notes (Signed)
Pre visit review using our clinic review tool, if applicable. No additional management support is needed unless otherwise documented below in the visit note. 

## 2014-04-09 NOTE — Assessment & Plan Note (Addendum)
Stable. Pt was given names of OTC probiotics to try. Discussed drinking lots of water and eating more fiber.

## 2014-04-09 NOTE — Assessment & Plan Note (Signed)
Unresolved. Gave shoulder pain handout. Will try ice pack on shoulder, continue Aleve and referral to Ortho. Suspect [place] or supraspinatous pathology.

## 2014-04-16 ENCOUNTER — Encounter (HOSPITAL_COMMUNITY): Payer: Self-pay | Admitting: *Deleted

## 2014-04-16 ENCOUNTER — Emergency Department (HOSPITAL_COMMUNITY)
Admission: EM | Admit: 2014-04-16 | Discharge: 2014-04-16 | Disposition: A | Discharge status: Home / Self Care | Payer: BC Managed Care – PPO | Attending: Emergency Medicine | Admitting: Emergency Medicine

## 2014-04-16 ENCOUNTER — Emergency Department (HOSPITAL_COMMUNITY): Payer: BC Managed Care – PPO

## 2014-04-16 DIAGNOSIS — K2970 Gastritis, unspecified, without bleeding: Secondary | ICD-10-CM | POA: Diagnosis not present

## 2014-04-16 DIAGNOSIS — Z7952 Long term (current) use of systemic steroids: Secondary | ICD-10-CM | POA: Insufficient documentation

## 2014-04-16 DIAGNOSIS — K209 Esophagitis, unspecified: Secondary | ICD-10-CM | POA: Insufficient documentation

## 2014-04-16 DIAGNOSIS — K5901 Slow transit constipation: Secondary | ICD-10-CM | POA: Insufficient documentation

## 2014-04-16 DIAGNOSIS — R079 Chest pain, unspecified: Secondary | ICD-10-CM | POA: Insufficient documentation

## 2014-04-16 DIAGNOSIS — Z79899 Other long term (current) drug therapy: Secondary | ICD-10-CM | POA: Insufficient documentation

## 2014-04-16 DIAGNOSIS — Z8619 Personal history of other infectious and parasitic diseases: Secondary | ICD-10-CM | POA: Insufficient documentation

## 2014-04-16 DIAGNOSIS — E559 Vitamin D deficiency, unspecified: Secondary | ICD-10-CM | POA: Diagnosis not present

## 2014-04-16 LAB — COMPREHENSIVE METABOLIC PANEL
ALBUMIN: 3.7 g/dL (ref 3.5–5.2)
ALT: 9 U/L (ref 0–35)
AST: 13 U/L (ref 0–37)
Alkaline Phosphatase: 81 U/L (ref 39–117)
Anion gap: 13 (ref 5–15)
BUN: 11 mg/dL (ref 6–23)
CALCIUM: 9 mg/dL (ref 8.4–10.5)
CO2: 25 mEq/L (ref 19–32)
CREATININE: 0.68 mg/dL (ref 0.50–1.10)
Chloride: 100 mEq/L (ref 96–112)
GFR calc Af Amer: >90 mL/min (ref >90)
GFR calc non Af Amer: >90 mL/min (ref >90)
Glucose, Bld: 102 mg/dL — ABNORMAL HIGH (ref 70–99)
Potassium: 3.7 mEq/L (ref 3.7–5.3)
Sodium: 138 mEq/L (ref 137–147)
Total Bilirubin: 0.3 mg/dL (ref 0.3–1.2)
Total Protein: 7.7 g/dL (ref 6.0–8.3)

## 2014-04-16 LAB — CBC WITH DIFFERENTIAL/PLATELET
Basophils Absolute: 0 10*3/uL (ref 0.0–0.1)
Basophils Relative: 1 % (ref 0–1)
EOS PCT: 3 % (ref 0–5)
Eosinophils Absolute: 0.2 10*3/uL (ref 0.0–0.7)
HEMATOCRIT: 36.9 % (ref 36.0–46.0)
Hemoglobin: 12.3 g/dL (ref 12.0–15.0)
LYMPHS ABS: 2.1 10*3/uL (ref 0.7–4.0)
LYMPHS PCT: 37 % (ref 12–46)
MCH: 29.1 pg (ref 26.0–34.0)
MCHC: 33.3 g/dL (ref 30.0–36.0)
MCV: 87.2 fL (ref 78.0–100.0)
Monocytes Absolute: 0.5 10*3/uL (ref 0.1–1.0)
Monocytes Relative: 9 % (ref 3–12)
NEUTROS ABS: 3 10*3/uL (ref 1.7–7.7)
Neutrophils Relative %: 52 % (ref 43–77)
PLATELETS: 244 10*3/uL (ref 150–400)
RBC: 4.23 MIL/uL (ref 3.87–5.11)
RDW: 12.9 % (ref 11.5–15.5)
WBC: 5.8 10*3/uL (ref 4.0–10.5)

## 2014-04-16 LAB — TROPONIN I: Troponin I: <0.3 ng/mL (ref <0.30)

## 2014-04-16 MED ORDER — GI COCKTAIL ~~LOC~~
30.0000 mL | Freq: Once | ORAL | Status: AC
  Administered 2014-04-16: 30 mL via ORAL
  Filled 2014-04-16: qty 30

## 2014-04-16 MED ORDER — LACTULOSE 10 GM/15ML PO SOLN: 10.0000 g | Freq: Two times a day (BID) | ORAL | Status: DC | PRN

## 2014-04-16 MED ORDER — LACTULOSE 10 GM/15ML PO SOLN
10.0000 g | Freq: Once | ORAL | Status: AC
  Administered 2014-04-16: 10 g via ORAL
  Filled 2014-04-16: qty 30

## 2014-04-16 NOTE — ED Notes (Signed)
Pt states she took ASA before coming to the ER.

## 2014-04-16 NOTE — ED Notes (Signed)
Pt alert & oriented x4, stable gait. Patient given discharge instructions, paperwork & prescription(s). Patient  instructed to stop at the registration desk to finish any additional paperwork. Patient verbalized understanding. Pt left department w/ no further questions. 

## 2014-04-16 NOTE — Discharge Instructions (Signed)
Use a stool softener medication twice a day until you have a bowel movement, then once a day for a week or 2. Use Maalox before meals and bedtime for 1 week.    Constipation Constipation is when a person has fewer than three bowel movements a week, has difficulty having a bowel movement, or has stools that are dry, hard, or larger than normal. As people grow older, constipation is more common. If you try to fix constipation with medicines that make you have a bowel movement (laxatives), the problem may get worse. Long-term laxative use may cause the muscles of the colon to become weak. A low-fiber diet, not taking in enough fluids, and taking certain medicines may make constipation worse.  CAUSES   Certain medicines, such as antidepressants, pain medicine, iron supplements, antacids, and water pills.   Certain diseases, such as diabetes, irritable bowel syndrome (IBS), thyroid disease, or depression.   Not drinking enough water.   Not eating enough fiber-rich foods.   Stress or travel.   Lack of physical activity or exercise.   Ignoring the urge to have a bowel movement.   Using laxatives too much.  SIGNS AND SYMPTOMS   Having fewer than three bowel movements a week.   Straining to have a bowel movement.   Having stools that are hard, dry, or larger than normal.   Feeling full or bloated.   Pain in the lower abdomen.   Not feeling relief after having a bowel movement.  DIAGNOSIS  Your health care provider will take a medical history and perform a physical exam. Further testing may be done for severe constipation. Some tests may include:  A barium enema X-ray to examine your rectum, colon, and, sometimes, your small intestine.   A sigmoidoscopy to examine your lower colon.   A colonoscopy to examine your entire colon. TREATMENT  Treatment will depend on the severity of your constipation and what is causing it. Some dietary treatments include drinking more  fluids and eating more fiber-rich foods. Lifestyle treatments may include regular exercise. If these diet and lifestyle recommendations do not help, your health care provider may recommend taking over-the-counter laxative medicines to help you have bowel movements. Prescription medicines may be prescribed if over-the-counter medicines do not work.  HOME CARE INSTRUCTIONS   Eat foods that have a lot of fiber, such as fruits, vegetables, whole grains, and beans.  Limit foods high in fat and processed sugars, such as french fries, hamburgers, cookies, candies, and soda.   A fiber supplement may be added to your diet if you cannot get enough fiber from foods.   Drink enough fluids to keep your urine clear or pale yellow.   Exercise regularly or as directed by your health care provider.   Go to the restroom when you have the urge to go. Do not hold it.   Only take over-the-counter or prescription medicines as directed by your health care provider. Do not take other medicines for constipation without talking to your health care provider first.  [place] IF:   You have bright red blood in your stool.   Your constipation lasts for more than 4 days or gets worse.   You have abdominal or rectal pain.   You have thin, pencil-like stools.   You have unexplained weight loss. MAKE SURE YOU:   Understand these instructions.  Will watch your condition.  Will get help right away if you are not doing well or get worse. Document Released:  01/31/2004 Document Revised: 05/09/2013 Document Reviewed: 02/13/2013 [facility] Patient Information 2015 [place], [place]. This information is not intended to replace advice given to you by your health care provider. Make sure you discuss any questions you have with your health care provider.  Gastritis, Adult Gastritis is soreness and swelling (inflammation) of the lining of the stomach. Gastritis can develop as a sudden onset (acute)  or long-term (chronic) condition. If gastritis is not treated, it can lead to stomach bleeding and ulcers. CAUSES  Gastritis occurs when the stomach lining is weak or damaged. Digestive juices from the stomach then inflame the weakened stomach lining. The stomach lining may be weak or damaged due to viral or bacterial infections. One common bacterial infection is the Helicobacter pylori infection. Gastritis can also result from excessive alcohol consumption, taking certain medicines, or having too much acid in the stomach.  SYMPTOMS  In some cases, there are no symptoms. When symptoms are present, they may include:  Pain or a burning sensation in the upper abdomen.  Nausea.  Vomiting.  An uncomfortable feeling of fullness after eating. DIAGNOSIS  Your caregiver may suspect you have gastritis based on your symptoms and a physical exam. To determine the cause of your gastritis, your caregiver may perform the following:  Blood or stool tests to check for the H pylori bacterium.  Gastroscopy. A thin, flexible tube (endoscope) is passed down the esophagus and into the stomach. The endoscope has a light and camera on the end. Your caregiver uses the endoscope to view the inside of the stomach.  Taking a tissue sample (biopsy) from the stomach to examine under a microscope. TREATMENT  Depending on the cause of your gastritis, medicines may be prescribed. If you have a bacterial infection, such as an H pylori infection, antibiotics may be given. If your gastritis is caused by too much acid in the stomach, H2 blockers or antacids may be given. Your caregiver may recommend that you stop taking aspirin, ibuprofen, or other nonsteroidal anti-inflammatory drugs (NSAIDs). HOME CARE INSTRUCTIONS  Only take over-the-counter or prescription medicines as directed by your caregiver.  If you were given antibiotic medicines, take them as directed. Finish them even if you start to feel better.  Drink enough  fluids to keep your urine clear or pale yellow.  Avoid foods and drinks that make your symptoms worse, such as:  Caffeine or alcoholic drinks.  Chocolate.  Peppermint or mint flavorings.  Garlic and onions.  Spicy foods.  Citrus fruits, such as oranges, lemons, or limes.  Tomato-based foods such as sauce, chili, salsa, and pizza.  Fried and fatty foods.  Eat small, frequent meals instead of large meals. SEEK IMMEDIATE MEDICAL CARE IF:   You have black or dark red stools.  You vomit blood or material that looks like coffee grounds.  You are unable to keep fluids down.  Your abdominal pain gets worse.  You have a fever.  You do not feel better after 1 week.  You have any other questions or concerns. MAKE SURE YOU:  Understand these instructions.  Will watch your condition.  Will get help right away if you are not doing well or get worse. Document Released: 04/28/2001 Document Revised: 11/03/2011 Document Reviewed: 06/17/2011 [facility] Patient Information 2015 [place], [place]. This information is not intended to replace advice given to you by your health care provider. Make sure you discuss any questions you have with your health care provider.

## 2014-04-16 NOTE — ED Notes (Signed)
Pt states she having sharp chest pains in the center of her chest that started around 2200 yesterday. Pt states she was having abdominal pain yesterday afternoon.

## 2014-04-16 NOTE — ED Provider Notes (Signed)
CSN: [id number]     Arrival date & time 04/16/14  0245 History   First MD Initiated Contact with Patient 04/16/14 [phone]     Chief Complaint  Patient presents with  . Chest Pain     (Consider location/radiation/quality/duration/timing/severity/associated sxs/prior Treatment) HPI   Pamela Nichols is a 48 y.o. female  who is here for evaluation of chest discomfort, sharp in nature, which started this evening.  She also had some upper abdominal pain in the last several days.  Her last bowel movement was 5 days ago.  She is troubled by constipation, chronically.  She has been using Aleve recently for right shoulder pain.  She does not  have a history of cardiac or lung problems and is not a smoker.  There are no other known modifying factors.   Past Medical History  Diagnosis Date  . Chicken pox   . Vitamin D deficiency    Past Surgical History  Procedure Laterality Date  . Tubal ligation     Family History  Problem Relation Age of Onset  . Hypertension Mother   . Mental illness Mother     Depression  . Cancer Father     prostate    History  Substance Use Topics  . Smoking status: Never Smoker   . Smokeless tobacco: Not on file  . Alcohol Use: No   OB History    No data available     Review of Systems  All other systems reviewed and are negative.     Allergies  Review of patient's allergies indicates no known allergies.  Home Medications   Prior to Admission medications   Medication Sig Start Date End Date Taking? Authorizing Provider  aspirin 500 MG tablet Take 500 mg by mouth every 6 (six) hours as needed for pain.   Yes Historical Provider, MD  Cholecalciferol (VITAMIN D) 2000 UNITS CAPS Take 1 capsule by mouth daily.    Yes Historical Provider, MD  ferrous sulfate 325 (65 FE) MG tablet Take 325 mg by mouth 2 (two) times daily with a meal.   Yes Historical Provider, MD  naproxen sodium (ALEVE) 220 MG tablet Take 440 mg by mouth daily as needed (for pain).    Yes Historical Provider, MD  triamcinolone cream (KENALOG) 0.1 % Apply 1 application topically 2 (two) times daily. 10/23/13   Raquel M Rey, NP   BP 131/87 mmHg  Pulse 84  Temp(Src) 98.1 F (36.7 C) (Oral)  Resp 16  Ht 5\' 2"  (1.575 m)  Wt 173 lb (78.472 kg)  BMI 31.63 kg/m2  SpO2 100%  LMP 04/02/2014 Physical Exam  Constitutional: She is oriented to person, place, and time. She appears well-developed and well-nourished.  HENT:  Head: Normocephalic and atraumatic.  Right Ear: External ear normal.  Left Ear: External ear normal.  Eyes: Conjunctivae and EOM are normal. Pupils are equal, round, and reactive to light.  Neck: Normal range of motion and phonation normal. Neck supple.  Cardiovascular: Normal rate, regular rhythm and normal heart sounds.   Pulmonary/Chest: Effort normal and breath sounds normal. She exhibits no bony tenderness.  Abdominal: Soft. There is tenderness (diffuse, mild).  Hyperactive bowel sounds  Musculoskeletal: Normal range of motion.  Neurological: She is alert and oriented to person, place, and time. No cranial nerve deficit or sensory deficit. She exhibits normal muscle tone. Coordination normal.  Skin: Skin is warm, dry and intact.  Psychiatric: She has a normal mood and affect. Her behavior is normal. Judgment and  thought content normal.  Nursing note and vitals reviewed.   ED Course  Procedures (including critical care time) Medications  gi cocktail (Maalox,Lidocaine,Donnatal) (not administered)  lactulose (CHRONULAC) 10 GM/15ML solution 10 g (not administered)    Patient Vitals for the past 24 hrs:  BP Temp Temp src Pulse Resp SpO2 Height Weight  04/16/14 0254 131/87 mmHg 98.1 F (36.7 C) Oral 84 16 100 % 5\' 2"  (1.575 m) 173 lb (78.472 kg)    4:43 AM Reevaluation with update and discussion. After initial assessment and treatment, an updated evaluation reveals she is more comfortable now.  Findings discussed with patient, all questions were  answered.. [place] Review Labs Reviewed  CBC WITH DIFFERENTIAL  COMPREHENSIVE METABOLIC PANEL  TROPONIN I    Imaging Review Dg Chest Portable 1 View  04/16/2014   CLINICAL DATA:  Acute onset of diffuse chest pain. Initial encounter.  EXAM: PORTABLE CHEST - 1 VIEW  COMPARISON:  Chest radiograph performed 09/25/2013  FINDINGS: The lungs are well-aerated and clear. There is no evidence of focal opacification, pleural effusion or pneumothorax.  The cardiomediastinal silhouette is within normal limits. No acute osseous abnormalities are seen.  IMPRESSION: No acute cardiopulmonary process seen.   Electronically Signed   By: Garald Balding M.D.   On: 04/16/2014 03:29     EKG Interpretation   Date/Time:  Monday April 16 2014 02:56:46 EST Ventricular Rate:  89 PR Interval:  148 QRS Duration: 77 QT Interval:  363 QTC Calculation: 442 R Axis:   54 Text Interpretation:  Sinus rhythm since last tracing no significant  change Confirmed by Aaran Enberg  MD, [facility] ([id number]) on 04/16/2014 3:37:02 AM      MDM   Final diagnoses:  Chest pain  Slow transit constipation  Gastritis    Chest discomfort consistent with esophagitis associated with gastritis.  She also has a secondary problem of constipation which could be contributory to the gastritis.  Doubt small bowel obstruction, pneumonia, ACS or PE.  Nursing Notes Reviewed/ Care Coordinated Applicable Imaging Reviewed Interpretation of Laboratory Data incorporated into ED treatment  The patient appears reasonably screened and/or stabilized for discharge and I doubt any other medical condition or other [facility] requiring further screening, evaluation, or treatment in the ED at this time prior to discharge.  Plan: Home Medications- Lactulose, Maalox; Home Treatments- rest; return here if the recommended treatment, does not improve the symptoms; Recommended follow up- PCP prn    Richarda Blade, MD 04/16/14 [phone]

## 2014-04-23 ENCOUNTER — Encounter: Payer: BC Managed Care – PPO | Admitting: Nurse Practitioner

## 2014-10-25 ENCOUNTER — Encounter: Payer: Self-pay | Admitting: Nurse Practitioner

## 2014-10-30 ENCOUNTER — Other Ambulatory Visit (HOSPITAL_COMMUNITY)
Admission: RE | Admit: 2014-10-30 | Discharge: 2014-10-30 | Disposition: A | Discharge status: Home / Self Care | Payer: Self-pay | Source: Ambulatory Visit | Attending: Nurse Practitioner | Admitting: Nurse Practitioner

## 2014-10-30 ENCOUNTER — Ambulatory Visit (INDEPENDENT_AMBULATORY_CARE_PROVIDER_SITE_OTHER): Payer: Self-pay | Admitting: Nurse Practitioner

## 2014-10-30 VITALS — BP 110/78 | HR 85 | Temp 98.1°F | Resp 16 | Ht 63.0 in | Wt 181.4 lb

## 2014-10-30 DIAGNOSIS — Z01419 Encounter for gynecological examination (general) (routine) without abnormal findings: Secondary | ICD-10-CM | POA: Insufficient documentation

## 2014-10-30 DIAGNOSIS — Z0000 Encounter for general adult medical examination without abnormal findings: Secondary | ICD-10-CM

## 2014-10-30 DIAGNOSIS — Z1151 Encounter for screening for human papillomavirus (HPV): Secondary | ICD-10-CM | POA: Insufficient documentation

## 2014-10-30 LAB — POCT URINALYSIS DIPSTICK
Bilirubin, UA: NEGATIVE
Blood, UA: NEGATIVE
GLUCOSE UA: NEGATIVE
KETONES UA: NEGATIVE
Leukocytes, UA: NEGATIVE
Nitrite, UA: NEGATIVE
PH UA: 5.5
PROTEIN UA: NEGATIVE
Spec Grav, UA: 1.025
UROBILINOGEN UA: 0.2

## 2014-10-30 MED ORDER — MELOXICAM 15 MG PO TABS: 15.0000 mg | ORAL_TABLET | Freq: Every day | ORAL | Status: DC

## 2014-10-30 NOTE — Progress Notes (Signed)
Subjective:    Patient ID: Pamela Nichols, female    DOB: 09/04/1965, 49 y.o.   MRN: [id number]  HPI  Pamela Nichols is a 49 yo female here for her annual exam.   1) Health Maintenance-   Diet- Eating out often, eating a lot of fast food- gets a salad sometimes  Exercise- Joined [employer] 2 weeks ago, and only went 3 times  Immunizations- Tdap- unsure   Mammogram- 2015, normal, going yearly   Pap- Had about 2 years ago, normal per pt   Colonoscopy- Had it done in [place] last year  Eye Exam- Not UTD; 3 years ago   Dental Exam- Not UTD  STD Testing-  Testing in the past 2-3 years ago   2) Chronic Problems-  Right hip pain- x 5-6 months   Constipation- Chronic, not having hard stools, just slow transit only   going once a week   GERD- triggers are tomatoes and sauce, Nexium takes as needed   Anemia- Heavy periods, no period in 3 months.   Review of Systems  Constitutional: Negative for fever, chills, diaphoresis, fatigue and unexpected weight change.  HENT: Negative for tinnitus and trouble swallowing.   Eyes: Negative for visual disturbance.  Respiratory: Negative for chest tightness, shortness of breath and wheezing.   Cardiovascular: Negative for chest pain, palpitations and leg swelling.  Gastrointestinal: Positive for constipation. Negative for nausea, vomiting, abdominal pain, diarrhea, blood in stool and rectal pain.  Endocrine: Negative for polydipsia, polyphagia and polyuria.  Genitourinary: Negative for dysuria, hematuria, vaginal discharge and vaginal pain.  Musculoskeletal: Positive for arthralgias. Negative for myalgias, back pain and gait problem.       Right hip and shoulder  Skin: Negative for color change and rash.  Neurological: Negative for dizziness, weakness, numbness and headaches.  Hematological: Does not bruise/bleed easily.  Psychiatric/Behavioral: Negative for suicidal ideas and sleep disturbance. The patient is not nervous/anxious.    Past Medical History    Diagnosis Date  . Chicken pox   . Vitamin D deficiency     History   Social History  . Marital Status: Married    Spouse Name: N/A  . Number of Children: 2  . Years of Education: 14   Occupational History  . Hair Stylist    Social History Main Topics  . Smoking status: Never Smoker   . Smokeless tobacco: Not on file  . Alcohol Use: No  . Drug Use: No  . Sexual Activity: Not on file   Other Topics Concern  . Not on file   Social History Narrative   Pamela Nichols grew up in [place]. She is married and lives in [place] with her husband. 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 She has 2 adult children from a previous marriage. Her children live in [place]. She enjoys being outdoors. She also enjoys relaxing and watching TV.    Past Surgical History  Procedure Laterality Date  . Tubal ligation      Family History  Problem Relation Age of Onset  . Hypertension Mother   . Mental illness Mother     Depression  . Cancer Father     prostate     No Known Allergies  Current Outpatient Prescriptions on File Prior to Visit  Medication Sig Dispense Refill  . aspirin 500 MG tablet Take 500 mg by mouth every 6 (six) hours as needed for pain.    Marland Kitchen  Cholecalciferol (VITAMIN D) 2000 UNITS CAPS Take 1 capsule by mouth daily.     . ferrous sulfate 325 (65 FE) MG tablet Take 325 mg by mouth 2 (two) times daily with a meal.    . lactulose (CHRONULAC) 10 GM/15ML solution Take 15 mLs (10 g total) by mouth 2 (two) times daily as needed for mild constipation or moderate constipation. 500 mL 0  . naproxen sodium (ALEVE) 220 MG tablet Take 440 mg by mouth daily as needed (for pain).    . triamcinolone cream (KENALOG) 0.1 % Apply 1 application topically 2 (two) times daily. 30 g 0   No current facility-administered medications on file prior to visit.      Objective:   Physical Exam  Constitutional: She is oriented to  person, place, and time. She appears well-developed and well-nourished. No distress.  BP 110/78 mmHg  Pulse 85  Temp(Src) 98.1 F (36.7 C)  Resp 16  Ht 5\' 3"  (1.6 m)  Wt 181 lb 6.4 oz (82.283 kg)  BMI 32.14 kg/m2  SpO2 98%   HENT:  Head: Normocephalic and atraumatic.  Right Ear: External ear normal.  Left Ear: External ear normal.  Nose: Nose normal.  Mouth/Throat: Oropharynx is clear and moist. No oropharyngeal exudate.  TMs and canals clear bilaterally  Eyes: Conjunctivae and EOM are normal. Pupils are equal, round, and reactive to light. Right eye exhibits no discharge. Left eye exhibits no discharge. No scleral icterus.  Neck: Normal range of motion. Neck supple. No thyromegaly present.  Cardiovascular: Normal rate, regular rhythm, normal heart sounds and intact distal pulses.  Exam reveals no gallop and no friction rub.   No murmur heard. Pulmonary/Chest: Effort normal and breath sounds normal. No respiratory distress. She has no wheezes. She has no rales. She exhibits no tenderness.  Breast exam without significant findings  Abdominal: Soft. Bowel sounds are normal. She exhibits no distension and no mass. There is no tenderness. There is no rebound and no guarding.  Genitourinary: Vagina normal and uterus normal. No vaginal discharge found.  Musculoskeletal: Normal range of motion. She exhibits no edema or tenderness.  Lymphadenopathy:    She has no cervical adenopathy.  Neurological: She is alert and oriented to person, place, and time. She has normal reflexes. No cranial nerve deficit. She exhibits normal muscle tone. Coordination normal.  Skin: Skin is warm and dry. No rash noted. She is not diaphoretic. No erythema. No pallor.  Psychiatric: She has a normal mood and affect. Her behavior is normal. Judgment and thought content normal.      Assessment & Plan:

## 2014-10-30 NOTE — Progress Notes (Signed)
Pre visit review using our clinic review tool, if applicable. No additional management support is needed unless otherwise documented below in the visit note. 

## 2014-10-30 NOTE — Patient Instructions (Addendum)
Come back for your FASTING labs in July (make a fasting lab appointment) nothing to eat or drink after midnight except water and black coffee.   Come back in 1 year for annual exam or see me when you need me.

## 2014-11-01 LAB — CYTOLOGY - PAP

## 2014-11-04 ENCOUNTER — Encounter: Payer: Self-pay | Admitting: Nurse Practitioner

## 2014-11-04 DIAGNOSIS — Z01419 Encounter for gynecological examination (general) (routine) without abnormal findings: Secondary | ICD-10-CM | POA: Insufficient documentation

## 2014-11-04 NOTE — Assessment & Plan Note (Signed)
PAP performed today, Breast exam completed. No significant findings either way

## 2014-11-04 NOTE — Assessment & Plan Note (Signed)
Discussed acute and chronic issues. Reviewed health maintenance measures, PFSHx, and immunizations. Obtain routine labs TSH, Lipid panel, CBC w/ diff, A1c, and CMET.

## 2015-06-20 ENCOUNTER — Encounter: Payer: Self-pay | Admitting: Nurse Practitioner

## 2015-06-20 ENCOUNTER — Ambulatory Visit (INDEPENDENT_AMBULATORY_CARE_PROVIDER_SITE_OTHER): Payer: BLUE CROSS/BLUE SHIELD | Admitting: Nurse Practitioner

## 2015-06-20 VITALS — BP 138/84 | HR 79 | Temp 97.7°F | Resp 16 | Ht 63.0 in | Wt 178.0 lb

## 2015-06-20 DIAGNOSIS — M545 Low back pain, unspecified: Secondary | ICD-10-CM

## 2015-06-20 DIAGNOSIS — E669 Obesity, unspecified: Secondary | ICD-10-CM

## 2015-06-20 MED ORDER — PHENTERMINE HCL 15 MG PO CAPS: 15.0000 mg | ORAL_CAPSULE | ORAL | Status: DC

## 2015-06-20 MED ORDER — MELOXICAM 15 MG PO TABS: 15.0000 mg | ORAL_TABLET | Freq: Every day | ORAL | Status: DC

## 2015-06-20 NOTE — Progress Notes (Signed)
Pre visit review using our clinic review tool, if applicable. No additional management support is needed unless otherwise documented below in the visit note. 

## 2015-06-20 NOTE — Progress Notes (Signed)
Patient ID: Pamela Nichols, female    DOB: [birth year]  Age: 50 y.o. MRN: [id number]  CC: Acute Visit   HPI Emmanuela Perrins presents for CC of right hip and low back pain.   1) Right hip and lower back   Onset- 1 year, worsening over the last few weeks  Location- Right hip and low back  Duration - daily  Characteristics- Pressure with sitting long time, aching, soreness, throbbing  Aggravating factors- going from standing to sitting/lying, gaining weight Relieving factors- None  Severity- mild-severe depending on situation   Treatment to date:  Ibuprofen  Heat  Ice   Patient feels that weight gain has really contributed to this as well as being on her feet for multiple hours  History Edel has a past medical history of Chicken pox and Vitamin D deficiency.   She has past surgical history that includes Tubal ligation.   Her family history includes Cancer in her father; Hypertension in her mother; Mental illness in her mother.She reports that she has never smoked. She does not have any smokeless tobacco history on file. She reports that she does not drink alcohol or use illicit drugs.  Outpatient Prescriptions Prior to Visit  Medication Sig Dispense Refill  . aspirin 500 MG tablet Take 500 mg by mouth every 6 (six) hours as needed for pain.    . Cholecalciferol (VITAMIN D) 2000 UNITS CAPS Take 1 capsule by mouth daily.     . ferrous sulfate 325 (65 FE) MG tablet Take 325 mg by mouth 2 (two) times daily with a meal.    . lactulose (CHRONULAC) 10 GM/15ML solution Take 15 mLs (10 g total) by mouth 2 (two) times daily as needed for mild constipation or moderate constipation. 500 mL 0  . triamcinolone cream (KENALOG) 0.1 % Apply 1 application topically 2 (two) times daily. 30 g 0  . meloxicam (MOBIC) 15 MG tablet Take 1 tablet (15 mg total) by mouth daily. 30 tablet 0  . naproxen sodium (ALEVE) 220 MG tablet Take 440 mg by mouth daily as needed (for pain).     No  facility-administered medications prior to visit.    ROS Review of Systems  Constitutional: Negative for fever, chills, diaphoresis and fatigue.  Respiratory: Negative for chest tightness, shortness of breath and wheezing.   Cardiovascular: Negative for chest pain, palpitations and leg swelling.  Gastrointestinal: Negative for nausea, vomiting and diarrhea.  Musculoskeletal: Positive for myalgias, back pain and arthralgias. Negative for joint swelling, gait problem, neck pain and neck stiffness.  Skin: Negative for rash.  Neurological: Negative for dizziness, weakness, numbness and headaches.  Psychiatric/Behavioral: The patient is not nervous/anxious.     Objective:  BP 138/84 mmHg  Pulse 79  Temp(Src) 97.7 F (36.5 C) (Oral)  Resp 16  Ht 5\' 3"  (1.6 m)  Wt 178 lb (80.74 kg)  BMI 31.54 kg/m2  SpO2 93%  Physical Exam  Constitutional: She is oriented to person, place, and time. She appears well-developed and well-nourished. No distress.  HENT:  Head: Normocephalic and atraumatic.  Right Ear: External ear normal.  Left Ear: External ear normal.  Cardiovascular: Normal rate, regular rhythm and normal heart sounds.  Exam reveals no gallop and no friction rub.   No murmur heard. Pulmonary/Chest: Effort normal and breath sounds normal. No respiratory distress. She has no wheezes. She has no rales. She exhibits no tenderness.  Musculoskeletal: Normal range of motion. She exhibits tenderness. She exhibits no edema.  Tenderness over right paraspinal lumbosacral  muscles down to buttock  Neurological: She is alert and oriented to person, place, and time. No cranial nerve deficit. She exhibits normal muscle tone. Coordination normal.  Negative straight leg raise bilaterally  Skin: Skin is warm and dry. No rash noted. She is not diaphoretic.  Psychiatric: She has a normal mood and affect. Her behavior is normal. Judgment and thought content normal.   Assessment & Plan:   Bettyanne was  seen today for acute visit.  Diagnoses and all orders for this visit:  Obese  Right-sided low back pain without sciatica  Other orders -     meloxicam (MOBIC) 15 MG tablet; Take 1 tablet (15 mg total) by mouth daily. -     phentermine 15 MG capsule; Take 1 capsule (15 mg total) by mouth every morning.  I have discontinued Ms. Canada's naproxen sodium. I am also having her start on phentermine. Additionally, I am having her maintain her Vitamin D, ferrous sulfate, triamcinolone cream, aspirin, lactulose, and meloxicam.  Meds ordered this encounter  Medications  . meloxicam (MOBIC) 15 MG tablet    Sig: Take 1 tablet (15 mg total) by mouth daily.    Dispense:  30 tablet    Refill:  0    Order Specific Question:  Supervising Provider    Answer:  Deborra Medina L [[id number]]  . phentermine 15 MG capsule    Sig: Take 1 capsule (15 mg total) by mouth every morning.    Dispense:  30 capsule    Refill:  0    Order Specific Question:  Supervising Provider    Answer:  Crecencio Mc [[id number]]     Follow-up: Return in about 4 weeks (around 07/18/2015) for Follow up in 1 month .

## 2015-06-20 NOTE — Patient Instructions (Signed)
Try the phentermine. Follow up in the office in 1 month.  If you get anxious, palpitations, or feel off stop the medication  Try the low back pain exercises and mobic if you work a long day

## 2015-06-25 DIAGNOSIS — E66811 Obesity, class 1: Secondary | ICD-10-CM | POA: Insufficient documentation

## 2015-06-25 DIAGNOSIS — M545 Low back pain, unspecified: Secondary | ICD-10-CM | POA: Insufficient documentation

## 2015-06-25 DIAGNOSIS — E669 Obesity, unspecified: Secondary | ICD-10-CM | POA: Insufficient documentation

## 2015-06-25 NOTE — Assessment & Plan Note (Signed)
New problem Patient would like to start conservatively Exam was very reassuring that this probably musculoskeletal Will start with Mobic 15 mg daily as needed for pain Gave handout on low back exercises and stretches and I want her to do these daily as well as use heat, ice Told her to stop the ibuprofen altogether while on Mobic Follow-up in one month

## 2015-06-25 NOTE — Assessment & Plan Note (Signed)
BMI currently 31 We'll try 15 mg of phentermine daily Gave patient precautions on insomnia, chest pain, anxiety, jitteriness, increased thirst. Asked her to stop the medication if needed these occur patient verbalized understanding.  Strongly advised patient to use with healthy diet and exercise Follow-up in one month

## 2015-07-16 ENCOUNTER — Other Ambulatory Visit: Payer: Self-pay | Admitting: Nurse Practitioner

## 2015-07-16 NOTE — Telephone Encounter (Signed)
Last refilled in February 2017. Please advise?

## 2015-07-24 ENCOUNTER — Ambulatory Visit: Payer: BLUE CROSS/BLUE SHIELD | Admitting: Nurse Practitioner

## 2015-07-29 ENCOUNTER — Ambulatory Visit (INDEPENDENT_AMBULATORY_CARE_PROVIDER_SITE_OTHER): Payer: BLUE CROSS/BLUE SHIELD | Admitting: Nurse Practitioner

## 2015-07-29 ENCOUNTER — Encounter: Payer: Self-pay | Admitting: Nurse Practitioner

## 2015-07-29 VITALS — BP 122/78 | HR 66 | Temp 98.0°F | Ht 63.0 in | Wt 178.0 lb

## 2015-07-29 DIAGNOSIS — M545 Low back pain, unspecified: Secondary | ICD-10-CM

## 2015-07-29 DIAGNOSIS — E669 Obesity, unspecified: Secondary | ICD-10-CM

## 2015-07-29 MED ORDER — MELOXICAM 15 MG PO TABS: ORAL_TABLET | ORAL | Status: DC

## 2015-07-29 MED ORDER — PHENTERMINE HCL 15 MG PO CAPS: 15.0000 mg | ORAL_CAPSULE | ORAL | Status: DC

## 2015-07-29 MED ORDER — CYCLOBENZAPRINE HCL 5 MG PO TABS: 5.0000 mg | ORAL_TABLET | Freq: Every day | ORAL | Status: DC

## 2015-07-29 NOTE — Progress Notes (Signed)
Pre visit review using our clinic review tool, if applicable. No additional management support is needed unless otherwise documented below in the visit note. 

## 2015-07-29 NOTE — Progress Notes (Signed)
Patient ID: [name], female    DOB: 20-Dec-1965  Age: 50 y.o. MRN: [id number]  CC: Follow-up and Back Pain   HPI Pamela Nichols presents for follow up and Back pain.   1) Follow up of back pain- tried Mobic at last visit  Helpful   2) Phentermine- was walking and lost weight via home scale then gained back  Took half of the last script and stopped due to lots of birthday parties.  Walking 2-3 days for 4 miles   History Pamela Nichols has a past medical history of Chicken pox and Vitamin D deficiency.   She has past surgical history that includes Tubal ligation.   Her family history includes Cancer in her father; Hypertension in her mother; Mental illness in her mother.She reports that she has never smoked. She has never used smokeless tobacco. She reports that she does not drink alcohol or use illicit drugs.  Outpatient Prescriptions Prior to Visit  Medication Sig Dispense Refill  . aspirin 500 MG tablet Take 500 mg by mouth every 6 (six) hours as needed for pain.    . Cholecalciferol (VITAMIN D) 2000 UNITS CAPS Take 1 capsule by mouth daily.     . ferrous sulfate 325 (65 FE) MG tablet Take 325 mg by mouth 2 (two) times daily with a meal.    . lactulose (CHRONULAC) 10 GM/15ML solution Take 15 mLs (10 g total) by mouth 2 (two) times daily as needed for mild constipation or moderate constipation. 500 mL 0  . meloxicam (MOBIC) 15 MG tablet TAKE 1 TABLET (15 MG TOTAL) BY MOUTH DAILY. 30 tablet 0  . phentermine 15 MG capsule Take 1 capsule (15 mg total) by mouth every morning. 30 capsule 0  . triamcinolone cream (KENALOG) 0.1 % Apply 1 application topically 2 (two) times daily. (Patient not taking: Reported on 07/29/2015) 30 g 0   No facility-administered medications prior to visit.    ROS Review of Systems  Constitutional: Negative for fever, chills, diaphoresis, fatigue and unexpected weight change.  Respiratory: Negative for chest tightness, shortness of breath and  wheezing.   Cardiovascular: Negative for chest pain, palpitations and leg swelling.  Gastrointestinal: Negative for nausea, vomiting and diarrhea.  Musculoskeletal: Positive for back pain.  Skin: Negative for rash.  Neurological: Negative for dizziness, weakness, numbness and headaches.  Psychiatric/Behavioral: The patient is not nervous/anxious.     Objective:  BP 122/78 mmHg  Pulse 66  Temp(Src) 98 F (36.7 C) (Oral)  Ht 5\' 3"  (1.6 m)  Wt 178 lb (80.74 kg)  BMI 31.54 kg/m2  SpO2 98%  Physical Exam  Constitutional: She is oriented to person, place, and time. She appears well-developed and well-nourished. No distress.  HENT:  Head: Normocephalic and atraumatic.  Right Ear: External ear normal.  Left Ear: External ear normal.  Cardiovascular: Normal rate, regular rhythm and normal heart sounds.   Pulmonary/Chest: Effort normal and breath sounds normal. No respiratory distress. She has no wheezes. She has no rales. She exhibits no tenderness.  Neurological: She is alert and oriented to person, place, and time. No cranial nerve deficit. She exhibits normal muscle tone. Coordination normal.  Skin: Skin is warm and dry. No rash noted. She is not diaphoretic.  Psychiatric: She has a normal mood and affect. Her behavior is normal. Judgment and thought content normal.   Assessment & Plan:   Pamela Nichols was seen today for follow-up and back pain.  Diagnoses and all orders for this visit:  Right-sided low back pain  without sciatica  Obese  Other orders -     meloxicam (MOBIC) 15 MG tablet; TAKE 1 TABLET (15 MG TOTAL) BY MOUTH DAILY. -     phentermine 15 MG capsule; Take 1 capsule (15 mg total) by mouth every morning. -     cyclobenzaprine (FLEXERIL) 5 MG tablet; Take 1 tablet (5 mg total) by mouth at bedtime.  I have discontinued Ms. Birt's triamcinolone cream. I am also having her start on cyclobenzaprine. Additionally, I am having her maintain her Vitamin D, ferrous sulfate,  aspirin, lactulose, meloxicam, and phentermine.  Meds ordered this encounter  Medications  . meloxicam (MOBIC) 15 MG tablet    Sig: TAKE 1 TABLET (15 MG TOTAL) BY MOUTH DAILY.    Dispense:  30 tablet    Refill:  0    Order Specific Question:  Supervising Provider    Answer:  Deborra Medina L [[id number]]  . phentermine 15 MG capsule    Sig: Take 1 capsule (15 mg total) by mouth every morning.    Dispense:  30 capsule    Refill:  0    Order Specific Question:  Supervising Provider    Answer:  Deborra Medina L [[id number]]  . cyclobenzaprine (FLEXERIL) 5 MG tablet    Sig: Take 1 tablet (5 mg total) by mouth at bedtime.    Dispense:  30 tablet    Refill:  0    Order Specific Question:  Supervising Provider    Answer:  Crecencio Mc [[id number]]     Follow-up: Return in about 6 weeks (around 09/09/2015) for Follow up.

## 2015-07-29 NOTE — Patient Instructions (Signed)
Physical for after 6.14.17   Follow up in 6 weeks for weight loss/back pain

## 2015-08-03 NOTE — Assessment & Plan Note (Signed)
Improving with help from Mobic

## 2015-08-03 NOTE — Assessment & Plan Note (Signed)
Same BMI- 31 No weight loss Pt stopped the medication for a few weeks due to engagements She denies any side effects with the medication and reports it helps her make better choices.  FU in 6 weeks

## 2015-09-09 ENCOUNTER — Encounter: Payer: Self-pay | Admitting: Nurse Practitioner

## 2015-09-09 ENCOUNTER — Ambulatory Visit (INDEPENDENT_AMBULATORY_CARE_PROVIDER_SITE_OTHER): Payer: BLUE CROSS/BLUE SHIELD | Admitting: Nurse Practitioner

## 2015-09-09 VITALS — BP 128/88 | HR 95 | Temp 98.1°F | Resp 16 | Ht 63.0 in | Wt 178.0 lb

## 2015-09-09 DIAGNOSIS — E669 Obesity, unspecified: Secondary | ICD-10-CM | POA: Diagnosis not present

## 2015-09-09 DIAGNOSIS — Z1239 Encounter for other screening for malignant neoplasm of breast: Secondary | ICD-10-CM

## 2015-09-09 DIAGNOSIS — E559 Vitamin D deficiency, unspecified: Secondary | ICD-10-CM

## 2015-09-09 LAB — LIPID PANEL
CHOL/HDL RATIO: 3
Cholesterol: 191 mg/dL (ref 0–200)
HDL: 74.2 mg/dL (ref >39.00)
LDL Cholesterol: 103 mg/dL — ABNORMAL HIGH (ref 0–99)
NONHDL: 116.85
TRIGLYCERIDES: 71 mg/dL (ref 0.0–149.0)
VLDL: 14.2 mg/dL (ref 0.0–40.0)

## 2015-09-09 LAB — COMPREHENSIVE METABOLIC PANEL
ALBUMIN: 4.2 g/dL (ref 3.5–5.2)
ALK PHOS: 83 U/L (ref 39–117)
ALT: 11 U/L (ref 0–35)
AST: 15 U/L (ref 0–37)
BUN: 7 mg/dL (ref 6–23)
CO2: 25 mEq/L (ref 19–32)
Calcium: 9.6 mg/dL (ref 8.4–10.5)
Chloride: 105 mEq/L (ref 96–112)
Creatinine, Ser: 0.66 mg/dL (ref 0.40–1.20)
GFR: 120.92 mL/min (ref >60.00)
Glucose, Bld: 94 mg/dL (ref 70–99)
Potassium: 4 mEq/L (ref 3.5–5.1)
Sodium: 138 mEq/L (ref 135–145)
TOTAL PROTEIN: 7.8 g/dL (ref 6.0–8.3)
Total Bilirubin: 0.4 mg/dL (ref 0.2–1.2)

## 2015-09-09 LAB — CBC WITH DIFFERENTIAL/PLATELET
Basophils Absolute: 0 10*3/uL (ref 0.0–0.1)
Basophils Relative: 0.7 % (ref 0.0–3.0)
EOS PCT: 7.1 % — AB (ref 0.0–5.0)
Eosinophils Absolute: 0.4 10*3/uL (ref 0.0–0.7)
HCT: 41.1 % (ref 36.0–46.0)
Hemoglobin: 13.6 g/dL (ref 12.0–15.0)
LYMPHS ABS: 2.4 10*3/uL (ref 0.7–4.0)
Lymphocytes Relative: 45.6 % (ref 12.0–46.0)
MCHC: 33 g/dL (ref 30.0–36.0)
MCV: 85.2 fl (ref 78.0–100.0)
Monocytes Absolute: 0.3 10*3/uL (ref 0.1–1.0)
Monocytes Relative: 6.6 % (ref 3.0–12.0)
Neutro Abs: 2.1 10*3/uL (ref 1.4–7.7)
Neutrophils Relative %: 40 % — ABNORMAL LOW (ref 43.0–77.0)
Platelets: 226 10*3/uL (ref 150.0–400.0)
RBC: 4.82 Mil/uL (ref 3.87–5.11)
RDW: 14.6 % (ref 11.5–15.5)
WBC: 5.2 10*3/uL (ref 4.0–10.5)

## 2015-09-09 LAB — HEMOGLOBIN A1C: HEMOGLOBIN A1C: 5.9 % (ref 4.6–6.5)

## 2015-09-09 LAB — TSH: TSH: 0.62 u[IU]/mL (ref 0.35–4.50)

## 2015-09-09 LAB — VITAMIN D 25 HYDROXY (VIT D DEFICIENCY, FRACTURES): VITD: 16.02 ng/mL — ABNORMAL LOW (ref 30.00–100.00)

## 2015-09-09 MED ORDER — PHENTERMINE HCL 15 MG PO CAPS: 15.0000 mg | ORAL_CAPSULE | ORAL | Status: DC

## 2015-09-09 NOTE — Patient Instructions (Signed)
[facility] at [facility]  Address: [address] [place], [place] [address]  Phone: [phone] Hours:  Monday - Thursday: 8 a.m. - 5 p.m. Friday: 8 a.m. - 3 p.m.  See you for your physical- visit the lab today

## 2015-09-09 NOTE — Progress Notes (Signed)
Patient ID: Pamela Nichols, female    DOB: 05-27-1965  Age: 50 y.o. MRN: [id number]  CC: Follow-up   HPI Pamela Nichols presents for follow up of sciatica.   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 Exercising is working well for her.   2) Has lost 4 lbs on her scale at home, feels this has helped her back.    History Pamela Nichols has a past medical history of Chicken pox and Vitamin D deficiency.   She has past surgical history that includes Tubal ligation.   Her family history includes Cancer in her father; Hypertension in her mother; Mental illness in her mother.She reports that she has never smoked. She has never used smokeless tobacco. She reports that she does not drink alcohol or use illicit drugs.  Outpatient Prescriptions Prior to Visit  Medication Sig Dispense Refill  . aspirin 500 MG tablet Take 500 mg by mouth every 6 (six) hours as needed for pain.    . Cholecalciferol (VITAMIN D) 2000 UNITS CAPS Take 1 capsule by mouth daily.     . cyclobenzaprine (FLEXERIL) 5 MG tablet Take 1 tablet (5 mg total) by mouth at bedtime. 30 tablet 0  . ferrous sulfate 325 (65 FE) MG tablet Take 325 mg by mouth 2 (two) times daily with a meal.    . meloxicam (MOBIC) 15 MG tablet TAKE 1 TABLET (15 MG TOTAL) BY MOUTH DAILY. 30 tablet 0  . phentermine 15 MG capsule Take 1 capsule (15 mg total) by mouth every morning. 30 capsule 0  . lactulose (CHRONULAC) 10 GM/15ML solution Take 15 mLs (10 g total) by mouth 2 (two) times daily as needed for mild constipation or moderate constipation. (Patient not taking: Reported on 09/09/2015) 500 mL 0   No facility-administered medications prior to visit.    ROS Review of Systems  Constitutional: Negative for fever, chills, diaphoresis and fatigue.  Respiratory: Negative for chest tightness, shortness of breath and wheezing.   Cardiovascular: Negative for chest pain, palpitations and leg swelling.   Gastrointestinal: Negative for nausea, vomiting and diarrhea.  Skin: Negative for rash.  Neurological: Negative for dizziness, weakness, numbness and headaches.  Psychiatric/Behavioral: The patient is not nervous/anxious.     Objective:  BP 128/88 mmHg  Pulse 95  Temp(Src) 98.1 F (36.7 C) (Oral)  Resp 16  Ht 5\' 3"  (1.6 m)  Wt 178 lb (80.74 kg)  BMI 31.54 kg/m2  SpO2 97%  Physical Exam  Constitutional: She is oriented to person, place, and time. She appears well-developed and well-nourished. No distress.  HENT:  Head: Normocephalic and atraumatic.  Right Ear: External ear normal.  Left Ear: External ear normal.  Cardiovascular: Normal rate, regular rhythm and normal heart sounds.   Pulmonary/Chest: Effort normal and breath sounds normal. No respiratory distress. She has no wheezes. She has no rales. She exhibits no tenderness.  Neurological: She is alert and oriented to person, place, and time. No cranial nerve deficit. She exhibits normal muscle tone. Coordination normal.  Skin: Skin is warm and dry. No rash noted. She is not diaphoretic.  Psychiatric: She has a normal mood and affect. Her behavior is normal. Judgment and thought content normal.   Assessment & Plan:   [name] was seen today for follow-up.  Diagnoses and all orders for this visit:  Screening for breast cancer -     MM Digital Screening; Future  Other orders -  phentermine 15 MG capsule; Take 1 capsule (15 mg total) by mouth every morning.   I have discontinued Pamela Nichols's lactulose. I am also having her maintain her Vitamin D, ferrous sulfate, aspirin, meloxicam, cyclobenzaprine, and phentermine.  Meds ordered this encounter  Medications  . phentermine 15 MG capsule    Sig: Take 1 capsule (15 mg total) by mouth every morning.    Dispense:  30 capsule    Refill:  1    Order Specific Question:  Supervising Provider    Answer:  Crecencio Mc [[id number]]     Follow-up: Return if symptoms  worsen or fail to improve.

## 2015-09-09 NOTE — Addendum Note (Signed)
Addended by: Karlene Einstein D on: 09/09/2015 02:16 PM   Modules accepted: Orders

## 2015-09-10 ENCOUNTER — Encounter: Payer: Self-pay | Admitting: Nurse Practitioner

## 2015-09-12 ENCOUNTER — Other Ambulatory Visit: Payer: Self-pay | Admitting: Nurse Practitioner

## 2015-09-12 NOTE — Assessment & Plan Note (Signed)
Checking A1c, thyroid, Lipid since fasting, and CBC w/ diff today

## 2015-09-12 NOTE — Assessment & Plan Note (Signed)
Checking a Vitamin D level today

## 2015-09-23 ENCOUNTER — Ambulatory Visit: Payer: BLUE CROSS/BLUE SHIELD | Attending: Nurse Practitioner

## 2015-11-04 ENCOUNTER — Encounter: Payer: Self-pay | Admitting: Family Medicine

## 2015-11-04 ENCOUNTER — Encounter: Payer: Self-pay | Admitting: Nurse Practitioner

## 2015-11-04 ENCOUNTER — Ambulatory Visit: Admission: RE | Admit: 2015-11-04 | Payer: BLUE CROSS/BLUE SHIELD | Source: Ambulatory Visit

## 2015-11-04 DIAGNOSIS — Z0289 Encounter for other administrative examinations: Secondary | ICD-10-CM

## 2015-11-11 ENCOUNTER — Encounter: Payer: Self-pay | Admitting: Family Medicine

## 2016-03-31 ENCOUNTER — Telehealth: Payer: Self-pay | Admitting: *Deleted

## 2016-03-31 DIAGNOSIS — Z1239 Encounter for other screening for malignant neoplasm of breast: Secondary | ICD-10-CM

## 2016-03-31 NOTE — Telephone Encounter (Signed)
Okay for mammo order.

## 2016-03-31 NOTE — Telephone Encounter (Addendum)
Pt was called to inform her of Mammogram being placed to Pamela Nichols.  Please inform pt when she calls back because a voicemail was left.

## 2016-03-31 NOTE — Telephone Encounter (Signed)
Pt was scheduled for a appointment with you but no showed. Recommendations?

## 2016-03-31 NOTE — Telephone Encounter (Signed)
Pt requested a mammogram order Pt contact [phone]

## 2016-05-06 ENCOUNTER — Ambulatory Visit: Payer: BLUE CROSS/BLUE SHIELD

## 2016-05-28 ENCOUNTER — Ambulatory Visit
Admission: RE | Admit: 2016-05-28 | Discharge: 2016-05-28 | Disposition: A | Discharge status: Home / Self Care | Payer: BLUE CROSS/BLUE SHIELD | Source: Ambulatory Visit | Attending: Family Medicine | Admitting: Family Medicine

## 2016-05-28 DIAGNOSIS — Z1231 Encounter for screening mammogram for malignant neoplasm of breast: Secondary | ICD-10-CM | POA: Diagnosis not present

## 2016-05-28 DIAGNOSIS — Z1239 Encounter for other screening for malignant neoplasm of breast: Secondary | ICD-10-CM

## 2016-10-26 ENCOUNTER — Encounter: Payer: Self-pay | Admitting: Physician Assistant

## 2016-10-26 ENCOUNTER — Ambulatory Visit (INDEPENDENT_AMBULATORY_CARE_PROVIDER_SITE_OTHER): Payer: BLUE CROSS/BLUE SHIELD | Admitting: Physician Assistant

## 2016-10-26 VITALS — BP 118/84 | HR 84 | Temp 97.9°F | Resp 16 | Wt 182.0 lb

## 2016-10-26 DIAGNOSIS — M545 Low back pain, unspecified: Secondary | ICD-10-CM

## 2016-10-26 DIAGNOSIS — K5909 Other constipation: Secondary | ICD-10-CM

## 2016-10-26 MED ORDER — CYCLOBENZAPRINE HCL 5 MG PO TABS: 5.0000 mg | ORAL_TABLET | Freq: Every day | ORAL | 0 refills | Status: DC

## 2016-10-26 MED ORDER — MELOXICAM 15 MG PO TABS: ORAL_TABLET | ORAL | 0 refills | Status: DC

## 2016-10-26 MED ORDER — LINACLOTIDE 145 MCG PO CAPS: 145.0000 ug | ORAL_CAPSULE | Freq: Every day | ORAL | 1 refills | Status: DC

## 2016-10-26 NOTE — Progress Notes (Signed)
Patient ID: Pamela Nichols MRN: [id number], DOB: 06/08/1965, 51 y.o. Date of Encounter: 10/26/2016, 3:42 PM    Chief Complaint:  Chief Complaint  Patient presents with  . New Patient (Initial Visit)     HPI: 51 y.o. year old female presents as a New Pt to [place].   She had been going to look bowel or [facility]. Says that the nurse practitioner that she was seeing their left and then they kept switching her to see different providers. Says that she wants to see the same provider not be switching around.  She usually has a physical every year. Last CPE was 09/09/15. She is interested in returning for a CPE and Pap smear.  Today there were a couple of things that she wanted to address. She states that she is trying to lose some weight. Says that she has made some diet changes and his started doing a boot camp class that she has been doing for almost 6 weeks. Says that she is feeling better since doing this exercise.  She states that she occasionally has pain in her right low back. She has no pain no numbness no tingling no weakness down the leg. 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 I reviewed that she had an office visit regarding right low back pain on 07/29/15 and at that time was prescribed Motrin and Flexeril. She says that she does think that those medicines helped some. However has no refill. So says that she can tell that the back pain is better since she has been doing the exercise. Also says that she walked 4 miles just a couple days ago and the right back pain got better with that walking.  Says that one other thing she wanted to address his constipation. Says that she has been treating this with green tea and prune juice and probiotic. However says that those things really do not help much. Also has been eating more salads and more greens since doing this exercise and trying to lose weight and that does help some but still is  having a lot of straining and hard stool and going several days between bowel movements.  Reviewed her medical history. Has history of vitamin D deficiency. Only surgery is tubal ligation. No other medical history.     Home Meds:   Outpatient Medications Prior to Visit  Medication Sig Dispense Refill  . aspirin 500 MG tablet Take 500 mg by mouth every 6 (six) hours as needed for pain.    . Cholecalciferol (VITAMIN D) 2000 UNITS CAPS Take 1 capsule by mouth daily.     . ferrous sulfate 325 (65 FE) MG tablet Take 325 mg by mouth 2 (two) times daily with a meal.    . phentermine 15 MG capsule Take 1 capsule (15 mg total) by mouth every morning. 30 capsule 1  . cyclobenzaprine (FLEXERIL) 5 MG tablet Take 1 tablet (5 mg total) by mouth at bedtime. 30 tablet 0  . meloxicam (MOBIC) 15 MG tablet TAKE 1 TABLET (15 MG TOTAL) BY MOUTH DAILY. 30 tablet 0   No facility-administered medications prior to visit.     Allergies: No Known Allergies    Review of Systems: See HPI for pertinent ROS. All other ROS negative.    Physical Exam: Blood pressure 118/84, pulse 84, temperature 97.9 F (36.6 C), resp. rate 16, weight 182 lb (82.6 kg), last menstrual period 04/02/2014, SpO2 98 %., Body mass  index is 32.24 kg/m. General:  AAF. Appears in no acute distress. Neck: Supple. No thyromegaly. No lymphadenopathy. Lungs: Clear bilaterally to auscultation without wheezes, rales, or rhonchi. Breathing is unlabored. Heart: Regular rhythm. No murmurs, rubs, or gallops. Abdomen: Soft, non-tender, non-distended with normoactive bowel sounds. No hepatomegaly. No rebound/guarding. No obvious abdominal masses. Msk:  Strength and tone normal for 51. Right low back she points to right low back at approximate L4 level is area of discomfort. There is minimal discomfort with palpation at that area. No tenderness with palpation of the sciatic notch. Extremities/Skin: Warm and dry.  Neuro: Alert and oriented X 3. Moves  all extremities spontaneously. Gait is normal. CNII-XII grossly in tact. Psych:  Responds to questions appropriately with a normal affect.     ASSESSMENT AND PLAN:  51 y.o. year old female with  1. Acute right-sided low back pain without sciatica Continue the exercise. Discussed the exercise and stretching will definitely help keep this controlled. As well weight loss will help. As well she can use the mobile and Flexeril as needed for symptom relief. - meloxicam (MOBIC) 15 MG tablet; TAKE 1 TABLET (15 MG TOTAL) BY MOUTH DAILY.  Dispense: 30 tablet; Refill: 0 - cyclobenzaprine (FLEXERIL) 5 MG tablet; Take 1 tablet (5 mg total) by mouth at bedtime.  Dispense: 30 tablet; Refill: 0  2. Chronic constipation I gave her a sample bottle of this 72 g dose as well as 1 sample bottle of the 53 g dose. Also gave her savings card that can be used with either dose. I told her to try the sample of the 2 different doses and if this 72 g dose seems to be a better fit for her then call us and we will send in a prescription for that dose and she continues to savings card on that dose. Discussed that she is to take just one of these per day first thing in the morning prior to eating or drinking. - linaclotide (LINZESS) 145 MCG CAPS capsule; Take 1 capsule (145 mcg total) by mouth daily before breakfast.  Dispense: 90 capsule; Refill: 1  She is off on Mondays. She is going to schedule CPE for a Monday morning and come fasting so we can recheck labs. Also she wants to go ahead and repeat Pap smear. I reviewed Pap smear performed 11/01/14 states that they did not collect adequate cells and needed repeat Pap. Reviewed her labs were normal 08/2015 -- other than low vitamin D-- but she does usually have physical and labs once a year.  [address] [place], [place], [facility] 10/26/2016 3:42 PM

## 2016-11-16 ENCOUNTER — Encounter: Payer: BLUE CROSS/BLUE SHIELD | Admitting: Physician Assistant

## 2016-11-16 ENCOUNTER — Encounter: Payer: Self-pay | Admitting: Physician Assistant

## 2016-11-23 ENCOUNTER — Other Ambulatory Visit: Payer: Self-pay | Admitting: Physician Assistant

## 2016-11-23 DIAGNOSIS — M545 Low back pain, unspecified: Secondary | ICD-10-CM

## 2016-11-23 NOTE — Telephone Encounter (Signed)
Refill appropriate 

## 2017-04-20 ENCOUNTER — Other Ambulatory Visit: Payer: Self-pay | Admitting: Physician Assistant

## 2017-04-20 DIAGNOSIS — M545 Low back pain, unspecified: Secondary | ICD-10-CM

## 2017-04-20 MED ORDER — CYCLOBENZAPRINE HCL 5 MG PO TABS: 5.0000 mg | ORAL_TABLET | Freq: Every day | ORAL | 6 refills | Status: DC

## 2017-04-20 NOTE — Telephone Encounter (Signed)
Pamela Nichols this Rx has been filled for 30 days 0 refills

## 2017-04-20 NOTE — Telephone Encounter (Signed)
Ok to go ahead and send in additional refills # 30 + 6

## 2017-04-20 NOTE — Telephone Encounter (Signed)
Ok to refill 

## 2017-04-20 NOTE — Telephone Encounter (Signed)
rx sent to pharmacy

## 2017-04-20 NOTE — Telephone Encounter (Deleted)
Refill appropriate 

## 2017-05-17 ENCOUNTER — Other Ambulatory Visit: Payer: Self-pay | Admitting: Physician Assistant

## 2017-05-17 DIAGNOSIS — M545 Low back pain, unspecified: Secondary | ICD-10-CM

## 2017-05-28 ENCOUNTER — Telehealth: Payer: Self-pay

## 2017-05-28 NOTE — Telephone Encounter (Signed)
Patient called requesting rx for pain medication because she fell at work trying to step up on the sidewalk and missed her step.  Patient states the crown of her head was bleeding just a little not enough to soak up a paper towel or anything.Patient states she hit her head on the glass window patient denies having any dizziness, denies having a headache, denies blurred vision.  Patient fell on her left arm and that is causing her some pain. Patient denies swelling or bruising on arm. Patient states she is able to lift her arm and mover her fingers.  Patient was instructed to take otc pain reliever motrin,advil or  tylenol to help with pain of arm also if she notices swelling to apply ice pack. Patient was instructed to go to ER if her head starts to bother her or if she starts to feel disoriented. Patient verbalizes understanding

## 2017-11-20 ENCOUNTER — Emergency Department (HOSPITAL_COMMUNITY)
Admission: EM | Admit: 2017-11-20 | Discharge: 2017-11-21 | Disposition: A | Discharge status: Home / Self Care | Payer: BLUE CROSS/BLUE SHIELD | Attending: Emergency Medicine | Admitting: Emergency Medicine

## 2017-11-20 ENCOUNTER — Emergency Department (HOSPITAL_COMMUNITY): Payer: BLUE CROSS/BLUE SHIELD

## 2017-11-20 ENCOUNTER — Other Ambulatory Visit: Payer: Self-pay

## 2017-11-20 ENCOUNTER — Encounter (HOSPITAL_COMMUNITY): Payer: Self-pay | Admitting: *Deleted

## 2017-11-20 DIAGNOSIS — R1031 Right lower quadrant pain: Secondary | ICD-10-CM

## 2017-11-20 DIAGNOSIS — M546 Pain in thoracic spine: Secondary | ICD-10-CM | POA: Insufficient documentation

## 2017-11-20 DIAGNOSIS — R55 Syncope and collapse: Secondary | ICD-10-CM

## 2017-11-20 DIAGNOSIS — R42 Dizziness and giddiness: Secondary | ICD-10-CM

## 2017-11-20 DIAGNOSIS — Z79899 Other long term (current) drug therapy: Secondary | ICD-10-CM | POA: Insufficient documentation

## 2017-11-20 LAB — COMPREHENSIVE METABOLIC PANEL
ALK PHOS: 79 U/L (ref 38–126)
ALT: 12 U/L (ref 0–44)
ANION GAP: 9 (ref 5–15)
AST: 16 U/L (ref 15–41)
Albumin: 3.8 g/dL (ref 3.5–5.0)
BUN: 8 mg/dL (ref 6–20)
CALCIUM: 9.2 mg/dL (ref 8.9–10.3)
CO2: 28 mmol/L (ref 22–32)
Chloride: 106 mmol/L (ref 98–111)
Creatinine, Ser: 0.79 mg/dL (ref 0.44–1.00)
GFR calc non Af Amer: >60 mL/min (ref >60)
Glucose, Bld: 83 mg/dL (ref 70–99)
POTASSIUM: 3.6 mmol/L (ref 3.5–5.1)
Sodium: 143 mmol/L (ref 135–145)
TOTAL PROTEIN: 6.9 g/dL (ref 6.5–8.1)
Total Bilirubin: 0.5 mg/dL (ref 0.3–1.2)

## 2017-11-20 LAB — URINALYSIS, ROUTINE W REFLEX MICROSCOPIC
BILIRUBIN URINE: NEGATIVE
Glucose, UA: NEGATIVE mg/dL
HGB URINE DIPSTICK: NEGATIVE
Ketones, ur: NEGATIVE mg/dL
Nitrite: NEGATIVE
Protein, ur: NEGATIVE mg/dL
Specific Gravity, Urine: 1.013 (ref 1.005–1.030)
pH: 7 (ref 5.0–8.0)

## 2017-11-20 LAB — CBC
HCT: 36.8 % (ref 36.0–46.0)
HEMOGLOBIN: 11.2 g/dL — AB (ref 12.0–15.0)
MCH: 26.2 pg (ref 26.0–34.0)
MCHC: 30.4 g/dL (ref 30.0–36.0)
MCV: 86.2 fL (ref 78.0–100.0)
Platelets: 229 10*3/uL (ref 150–400)
RBC: 4.27 MIL/uL (ref 3.87–5.11)
RDW: 15.7 % — ABNORMAL HIGH (ref 11.5–15.5)
WBC: 6 10*3/uL (ref 4.0–10.5)

## 2017-11-20 LAB — I-STAT BETA HCG BLOOD, ED (MC, WL, AP ONLY)

## 2017-11-20 LAB — LIPASE, BLOOD: Lipase: 37 U/L (ref 11–51)

## 2017-11-20 MED ORDER — CYCLOBENZAPRINE HCL 5 MG PO TABS: 5.0000 mg | ORAL_TABLET | Freq: Three times a day (TID) | ORAL | 0 refills | Status: DC | PRN

## 2017-11-20 MED ORDER — IOHEXOL 300 MG/ML  SOLN
100.0000 mL | Freq: Once | INTRAMUSCULAR | Status: AC | PRN
  Administered 2017-11-20: 100 mL via INTRAVENOUS

## 2017-11-20 MED ORDER — KETOROLAC TROMETHAMINE 15 MG/ML IJ SOLN
15.0000 mg | Freq: Once | INTRAMUSCULAR | Status: AC
  Administered 2017-11-20: 15 mg via INTRAVENOUS
  Filled 2017-11-20: qty 1

## 2017-11-20 MED ORDER — ACETAMINOPHEN 500 MG PO TABS
1000.0000 mg | ORAL_TABLET | Freq: Once | ORAL | Status: AC
  Administered 2017-11-20: 1000 mg via ORAL
  Filled 2017-11-20: qty 2

## 2017-11-20 MED ORDER — SODIUM CHLORIDE 0.9 % IV BOLUS
500.0000 mL | Freq: Once | INTRAVENOUS | Status: AC
  Administered 2017-11-20: 500 mL via INTRAVENOUS

## 2017-11-20 MED ORDER — FENTANYL CITRATE (PF) 100 MCG/2ML IJ SOLN
50.0000 ug | INTRAMUSCULAR | Status: DC | PRN
  Administered 2017-11-20: 50 ug via INTRAVENOUS
  Filled 2017-11-20: qty 2

## 2017-11-20 MED ORDER — NAPROXEN 500 MG PO TABS: 500.0000 mg | ORAL_TABLET | Freq: Two times a day (BID) | ORAL | 0 refills | Status: DC | PRN

## 2017-11-20 NOTE — Discharge Instructions (Signed)
Stay well-hydrated. Use Tylenol and naproxen as needed for pain. For muscle spasm use heat pads or Flexeril as needed.  If you were given medicines take as directed.  If you are on coumadin or contraceptives realize their levels and effectiveness is altered by many different medicines.  If you have any reaction (rash, tongues swelling, other) to the medicines stop taking and see a physician.    If your blood pressure was elevated in the ER make sure you follow up for management with a primary doctor or return for chest pain, shortness of breath or stroke symptoms.  Please follow up as directed and return to the ER or see a physician for new or worsening symptoms.  Thank you. Vitals:   11/20/17 1946 11/20/17 1947 11/20/17 2045 11/20/17 2100  BP: 107/71  114/73 108/70  Pulse:  100 87 89  Resp:      Temp:      TempSrc:      SpO2:  99% 100% 100%  Weight:      Height:

## 2017-11-20 NOTE — ED Notes (Signed)
Patient transported to CT 

## 2017-11-20 NOTE — ED Provider Notes (Signed)
[facility] DEPARTMENT Provider Note   CSN: [id number] Arrival date & time: 11/20/17  1850     History   Chief Complaint Chief Complaint  Patient presents with  . Flank Pain    HPI Pamela Nichols is a 52 y.o. female.  [name] is a 56 YOF w/ a history of right sided lower back pain and only abd surgical hx tubal ligation presenting with back pain, a presyncopal episode, and abdominal pain.  Her lower back pain is on the right side in the same location as her chronic back pain, but is more severe than any previous episodes.  She had been having a pain flair last night, and used ice and heat, but this morning found it to be much worse.  She finds it is significantly worse with movement in general and improved with IBU.  It does not radiate.  Pert negatives below.  She denies fevers, IVDA, N/V, vaginal discharge or pain, dysuria or frequency, recent trauma, or weakness and numbness in her legs.  Denies cancer history, ripping or tearing pain, and smoking hx.   Her presyncopal episode occurred this AM, when she rose from bed and moved to stand she felt light-headed and as thought she would pass out, her husband "had to catch her" and they noticed her pajamas were wet afterward.  She denies hitting her head or any symptoms leading up to the event.  Denies history of seizures or problems with her heart.  She denies bleeding from vagina or rectum.  Denies SOB or CP.  She has been eating well but has been exercising more than usual (20mi daily outside on track), and only drinking 5x16oz bottles daily of fluid.    She did not initially endorse abdominal pain, but after tenderness is found on exam, she believes it may have started with the back pain.  Abd pain has not changed in location or character.               Past Medical History:  Diagnosis Date  . Chicken pox   . Vitamin D deficiency     Patient Active Problem List   Diagnosis Date Noted  .  Obese 06/25/2015  . Low back pain 06/25/2015  . Encounter for routine gynecological examination 11/04/2014  . Right shoulder pain 04/09/2014  . Abnormal laboratory test result 10/23/2013  . Skin lesion 10/23/2013  . Abdominal pain, epigastric 10/23/2013  . Constipation 07/10/2013  . Screening for colon cancer 07/10/2013  . Screening for breast cancer 07/10/2013  . Vitamin D deficiency 07/10/2013  . Routine general medical examination at a health care facility 07/10/2013    Past Surgical History:  Procedure Laterality Date  . TUBAL LIGATION       OB History   None      Home Medications    Prior to Admission medications   Medication Sig Start Date End Date Taking? Authorizing Provider  aspirin 500 MG tablet Take 500 mg by mouth every 6 (six) hours as needed for pain.    [provider]  Cholecalciferol (VITAMIN D) 2000 UNITS CAPS Take 1 capsule by mouth daily.     [provider]  cyclobenzaprine (FLEXERIL) 5 MG tablet Take 1 tablet (5 mg total) by mouth at bedtime. 04/20/17   Orlena Sheldon, PA-C  ferrous sulfate 325 (65 FE) MG tablet Take 325 mg by mouth 2 (two) times daily with a meal.    [provider]  linaclotide Rolan Lipa) 145  MCG CAPS capsule Take 1 capsule (145 mcg total) by mouth daily before breakfast. 10/26/16   Orlena Sheldon, PA-C  meloxicam (MOBIC) 15 MG tablet TAKE 1 TABLET BY MOUTH EVERY DAY 05/17/17   Orlena Sheldon, PA-C  phentermine 15 MG capsule Take 1 capsule (15 mg total) by mouth every morning. 09/09/15   Rubbie Battiest, RN    Family History Family History  Problem Relation Age of Onset  . Hypertension Mother   . Mental illness Mother        Depression  . Cancer Father        prostate   . Breast cancer Neg Hx     Social History Social History   Tobacco Use  . Smoking status: Never Smoker  . Smokeless tobacco: Never Used  Substance Use Topics  . Alcohol use: No    Alcohol/week: 0.0 oz  . Drug use: No      Allergies   Patient has no known allergies.   Review of Systems Review of Systems  Constitutional: Negative for chills and fever.  HENT: Negative for congestion.   Eyes: Negative for visual disturbance.  Respiratory: Negative for shortness of breath.   Cardiovascular: Negative for chest pain.  Gastrointestinal: Positive for abdominal pain and nausea. Negative for vomiting.  Genitourinary: Positive for flank pain. Negative for dysuria.  Musculoskeletal: Negative for back pain, neck pain and neck stiffness.  Skin: Negative for rash.  Neurological: Positive for light-headedness. Negative for headaches.     Physical Exam Updated Vital Signs BP 108/70   Pulse 89   Temp 98.1 F (36.7 C) (Oral)   Resp 20   Ht 5\' 1"  (1.549 m)   Wt 74.8 kg (165 lb)   LMP 04/02/2014   SpO2 100%   BMI 31.18 kg/m   Physical Exam  Constitutional: She is oriented to person, place, and time. She appears well-developed and well-nourished. No distress.  Neck: Normal range of motion. Neck supple.  Cardiovascular: Normal rate, regular rhythm, normal heart sounds and intact distal pulses.  Pulmonary/Chest: Effort normal and breath sounds normal.  Abdominal: Soft. She exhibits no distension and no mass. There is tenderness. There is rebound. There is no guarding. No hernia.  Genitourinary:  Genitourinary Comments: No CVA tenderness.   Musculoskeletal:  Tender over right SI location and diffuse tender medially and cranially from that area.  No warmth, redness, or overlying skin change.   Neurological: She is alert and oriented to person, place, and time. She has normal strength. No cranial nerve deficit or sensory deficit. GCS eye subscore is 4. GCS verbal subscore is 5. GCS motor subscore is 6.  Skin: Skin is warm and dry. Capillary refill takes less than 2 seconds. She is not diaphoretic.  Psychiatric: She has a normal mood and affect. Her behavior is normal. Judgment and thought content normal.   Nursing note and vitals reviewed.    ED Treatments / Results  Labs (all labs ordered are listed, but only abnormal results are displayed) Labs Reviewed  CBC - Abnormal; Notable for the following components:      Result Value   Hemoglobin 11.2 (*)    RDW 15.7 (*)    All other components within normal limits  URINALYSIS, ROUTINE W REFLEX MICROSCOPIC - Abnormal; Notable for the following components:   Leukocytes, UA TRACE (*)    Bacteria, UA RARE (*)    All other components within normal limits  LIPASE, BLOOD  COMPREHENSIVE METABOLIC PANEL  I-STAT BETA  HCG BLOOD, ED ([facility], [facility], [facility] ONLY)  Pain meds was  EKG None EKG reviewed heart rate 89, sinus, normal QT, no acute ST changes sinus. Radiology Ct Abdomen Pelvis W Contrast  Result Date: 11/20/2017 CLINICAL DATA:  RIGHT flank and RIGHT lower quadrant pain since this morning, no bowel movement for 3 days EXAM: CT ABDOMEN AND PELVIS WITH CONTRAST TECHNIQUE: Multidetector CT imaging of the abdomen and pelvis was performed using the standard protocol following bolus administration of intravenous contrast. Sagittal and coronal MPR images reconstructed from axial data set. CONTRAST:  148mL OMNIPAQUE IOHEXOL 300 MG/ML SOLN IV. No oral contrast. COMPARISON:  None FINDINGS: Lower chest: Minimal subsegmental atelectasis at lung bases. Small pericardial effusion. Hepatobiliary: Gallbladder and liver normal appearance Pancreas: Normal appearance Spleen: Normal appearance Adrenals/Urinary Tract: Adrenal glands, kidneys, ureters, and bladder normal appearance Stomach/Bowel: Normal appendix. Stomach distended by fluid and food debris. Normal retained stool burden. Large and small bowel loops unremarkable. Vascular/Lymphatic: Minimal atherosclerotic calcification aorta without aneurysm. He no adenopathy Reproductive: Enlarged uterus containing multiple nodules likely multiple leiomyomata up to 6.6 x 5.8 x 6.6 cm. Unremarkable adnexa Other: Small umbilical hernia  containing fat. No free air or free fluid. No definite inflammatory process. Musculoskeletal: No acute osseous findings. Facet degenerative changes lower lumbar spine greatest at L5-S1. IMPRESSION: Enlarged uterus containing multiple nodules likely leiomyomata. Umbilical hernia containing fat. Small pericardial effusion of uncertain etiology; consider follow-up echocardiography. Electronically Signed   By: Lavonia Dana M.D.   On: 11/20/2017 21:56    Procedures Procedures (including critical care time)  Medications Ordered in ED Medications  fentaNYL (SUBLIMAZE) injection 50 mcg (50 mcg Intravenous Given 11/20/17 2031)  sodium chloride 0.9 % bolus 500 mL (0 mLs Intravenous Stopped 11/20/17 2151)  acetaminophen (TYLENOL) tablet 1,000 mg (1,000 mg Oral Given 11/20/17 2151)  ketorolac (TORADOL) 15 MG/ML injection 15 mg (15 mg Intravenous Given 11/20/17 2151)  iohexol (OMNIPAQUE) 300 MG/ML solution 100 mL (100 mLs Intravenous Contrast Given 11/20/17 2131)     Initial Impression / Assessment and Plan / ED Course  I have reviewed the triage vital signs and the nursing notes.  Pertinent labs & imaging results that were available during my care of the patient were reviewed by me and considered in my medical decision making (see chart for details).   Time: 21:00 --Mrs Boeh is a 18yof w/ a hx of right lower back pain, her pain today is similar in location and character to previous, but is worse than previous episodes, and accompanied by abdominal tenderness and rebound.  Despite normal WBC, good PO intake, lack of N/V, or change in character of her symptoms, appendicitis is still on the differential and cannot be ruled out.  After fentanyl is given, repeat abdominal exam still shows suprapubic and RLQ tenderness and mild rebound w/o guarding.  After shared decision making with patient CT abd decision was made.    She describes a presyncopal episode this AM, and she is low risk at this time due to  lack of SOB,  HCT>30%, normotensive BP, lack of cardiac hx or signs of CHF, lack of family hx.  In the setting of summer [place] heat, 62mi exercise daily outside, and ~2.5L fluid intake daily, with slightly dry mucus membranes and borderline tachycardia, and onset moving from laying to standing, it could have been orthostatic in nature.  Therefore pt is given 0.5L bolus and drinks a cup of water PO.    Patient presents with lightheaded episode this morning, improved with  fluids.  Patient also has clinically musculoskeletal back pain that improved with pain meds.  Normal neurologic exam.  Furthermore patient has right lower quadrant tenderness CT scan normal appendix no acute findings.  Blood work reviewed unremarkable.  Patient stable for outpatient follow-up.   Final Clinical Impressions(s) / ED Diagnoses   Final diagnoses:  Acute right-sided thoracic back pain  Right lower quadrant abdominal pain  Postural dizziness with presyncope    ED Discharge Orders    None       Elnora Morrison, MD 11/20/17 2220

## 2017-11-20 NOTE — ED Notes (Signed)
ED Provider at bedside. 

## 2017-11-20 NOTE — ED Notes (Signed)
Family at bedside. 

## 2017-11-20 NOTE — ED Triage Notes (Signed)
The pt is c/o  Rt flank pain since this am  No bm for 3 days  No n or v v no urinary symptoms  lmp  none

## 2017-11-20 NOTE — ED Notes (Signed)
Patient is alert and orientedx4.  Patient was explained discharge instructions and they understood them with no questions.  The patient's husband, Keyira Mondesir is taking the patient home.

## 2017-11-23 ENCOUNTER — Ambulatory Visit (INDEPENDENT_AMBULATORY_CARE_PROVIDER_SITE_OTHER): Payer: BLUE CROSS/BLUE SHIELD | Admitting: Family Medicine

## 2017-11-23 ENCOUNTER — Encounter: Payer: Self-pay | Admitting: Family Medicine

## 2017-11-23 VITALS — BP 118/82 | HR 80 | Temp 98.1°F | Resp 18 | Ht 62.0 in | Wt 170.0 lb

## 2017-11-23 DIAGNOSIS — M545 Low back pain, unspecified: Secondary | ICD-10-CM

## 2017-11-23 DIAGNOSIS — I3139 Other pericardial effusion (noninflammatory): Secondary | ICD-10-CM

## 2017-11-23 DIAGNOSIS — I313 Pericardial effusion (noninflammatory): Secondary | ICD-10-CM | POA: Diagnosis not present

## 2017-11-23 DIAGNOSIS — K5909 Other constipation: Secondary | ICD-10-CM

## 2017-11-23 DIAGNOSIS — D259 Leiomyoma of uterus, unspecified: Secondary | ICD-10-CM | POA: Diagnosis not present

## 2017-11-23 HISTORY — DX: Pericardial effusion (noninflammatory): I31.3

## 2017-11-23 HISTORY — DX: Other pericardial effusion (noninflammatory): I31.39

## 2017-11-23 MED ORDER — CYCLOBENZAPRINE HCL 5 MG PO TABS: 5.0000 mg | ORAL_TABLET | Freq: Three times a day (TID) | ORAL | 0 refills | Status: DC | PRN

## 2017-11-23 MED ORDER — LINACLOTIDE 145 MCG PO CAPS: 145.0000 ug | ORAL_CAPSULE | Freq: Every day | ORAL | 1 refills | Status: DC

## 2017-11-23 NOTE — Progress Notes (Signed)
Patient ID: Pamela Nichols, female    DOB: 03-06-1966, 52 y.o.   MRN: [id number]  PCP: Orlena Sheldon, PA-C  Chief Complaint  Patient presents with  . Back Pain    lower right started saturday     Subjective:   Pamela Nichols is a 52 y.o. female, presents to clinic with CC of right low back pain, reoccurred 3 days ago when she had a fall secondary to acute severe abd pain causing syncopal episode.  Pain was severe for 2 days and has improved yesterday and today.  Currently pain is located to her right low back, without radiation, rated 3 out of 10, feels achy and tight, worse at night and first thing in the morning, improves after taking a warm shower and getting up and moving her body.  She has had over a year of intermittent right low back pain that has been responsive to Flexeril and naproxen, she was given muscle relaxers by the ER and this has helped improve pain.  She denies any numbness in her lower external knees, weakness, saddle anesthesia, incontinence of urine or stool.  She has no radiation of pain shooting down her leg.   She wanted to be seen to be able to get a refill of muscle relaxers. Follow-up from her ER visit for severe abdominal pain.  She did have some stool burden, uterine fibroids, large distended and full stomach, CT scan also showed no acute osseous findings but facet degenerative changes to lower lumbar spine at L5-S1.  Other incidental findings of small pericardial effusion umbilical hernia containing fat.  Patient does suffer from chronic constipation request a refill of linzess.  Last had a bowel movement 2 days ago, it is common for her to go several days without a bowel movement, have some pain, cramping and straining. We discussed findings on CT scan including uterine findings which are likely fibroids.  She is postmenopausal.  Denies any urinary symptoms.  When she is severely constipated she does feel like her lower pelvis is firm and tender until she  is able to have a bowel movement.  She does not see OB/GYN and is coming in in 2 days to see her PCP for a CPE.  Incidental finding of small pericardial effusion, she denies any chest pain, inspiratory chest pain, shortness of breath, palpitations, near-syncope, orthopnea, lower extremity edema.  She denies any history of lupus or any strong family history.  No other arthritis except for her right low back.  No rashes, no recent infections or viral illnesses.  No tick bites or fevers.   ER visit 11/20/17 - note, CT scan, EKG, labs all reviewed  Patient Active Problem List   Diagnosis Date Noted  . Obese 06/25/2015  . Low back pain 06/25/2015  . Encounter for routine gynecological examination 11/04/2014  . Right shoulder pain 04/09/2014  . Abnormal laboratory test result 10/23/2013  . Skin lesion 10/23/2013  . Abdominal pain, epigastric 10/23/2013  . Constipation 07/10/2013  . Screening for colon cancer 07/10/2013  . Screening for breast cancer 07/10/2013  . Vitamin D deficiency 07/10/2013  . Routine general medical examination at a health care facility 07/10/2013     Prior to Admission medications   Medication Sig Start Date End Date Taking? Authorizing Provider  aspirin 500 MG tablet Take 500 mg by mouth every 6 (six) hours as needed for pain.   Yes [provider]  Cholecalciferol (VITAMIN D) 2000 UNITS CAPS Take 1 capsule by mouth  daily.    Yes [provider]  ferrous sulfate 325 (65 FE) MG tablet Take 325 mg by mouth 2 (two) times daily with a meal.   Yes [provider]  meloxicam (MOBIC) 15 MG tablet TAKE 1 TABLET BY MOUTH EVERY DAY 05/17/17  Yes Dena Billet B, PA-C  naproxen (NAPROSYN) 500 MG tablet Take 1 tablet (500 mg total) by mouth 2 (two) times daily as needed. 11/20/17  Yes Elnora Morrison, MD  cyclobenzaprine (FLEXERIL) 5 MG tablet Take 1-2 tablets (5-10 mg total) by mouth 3 (three) times daily as needed for muscle spasms. 11/23/17   Delsa Grana,  PA-C  linaclotide Rolan Lipa) 145 MCG CAPS capsule Take 1 capsule (145 mcg total) by mouth daily before breakfast. 11/23/17   Delsa Grana, PA-C     No Known Allergies   Family History  Problem Relation Age of Onset  . Hypertension Mother   . Mental illness Mother        Depression  . Cancer Father        prostate   . Breast cancer Neg Hx      Social History   Socioeconomic History  . Marital status: Married    Spouse name: Not on file  . Number of children: 2  . Years of education: 1  . Highest education level: Not on file  Occupational History  . Occupation: Hair Stylist  Social Needs  . Financial resource strain: Not on file  . Food insecurity:    Worry: Not on file    Inability: Not on file  . Transportation needs:    Medical: Not on file    Non-medical: Not on file  Tobacco Use  . Smoking status: Never Smoker  . Smokeless tobacco: Never Used  Substance and Sexual Activity  . Alcohol use: No    Alcohol/week: 0.0 oz  . Drug use: No  . Sexual activity: Not on file  Lifestyle  . Physical activity:    Days per week: Not on file    Minutes per session: Not on file  . Stress: Not on file  Relationships  . Social connections:    Talks on phone: Not on file    Gets together: Not on file    Attends religious service: Not on file    Active member of club or organization: Not on file    Attends meetings of clubs or organizations: Not on file    Relationship status: Not on file  . Intimate partner violence:    Fear of current or ex partner: Not on file    Emotionally abused: Not on file    Physically abused: Not on file    Forced sexual activity: Not on file  Other Topics Concern  . Not on file  Social History Narrative   Pamela Nichols grew up in [place]. She is married and lives in [place] with her husband. She works as a Probation officer. She and her husband are raising their nephew that is 51 years old. She has 2 adult children from a previous marriage.  Her children live in [place]. She enjoys being outdoors. She also enjoys relaxing and watching TV.     Review of Systems  Constitutional: Negative.  Negative for activity change, appetite change, fatigue and unexpected weight change.  HENT: Negative.   Eyes: Negative.   Respiratory: Negative.  Negative for apnea, choking, chest tightness and shortness of breath.   Cardiovascular: Negative.  Negative for chest pain, palpitations and leg swelling.  Gastrointestinal: Negative.  Negative for abdominal pain and blood in stool.  Endocrine: Negative.   Genitourinary: Negative.  Negative for decreased urine volume, difficulty urinating and urgency.  Musculoskeletal: Positive for back pain. Negative for arthralgias, gait problem, joint swelling, myalgias, neck pain and neck stiffness.  Skin: Negative.  Negative for color change, pallor and rash.  Allergic/Immunologic: Negative.   Neurological: Negative.  Negative for dizziness, syncope, weakness, numbness and headaches.  Hematological: Negative.   Psychiatric/Behavioral: Negative.  Negative for confusion, dysphoric mood, self-injury and suicidal ideas. The patient is not nervous/anxious.   All other systems reviewed and are negative.      Objective:    Vitals:   11/23/17 1104  BP: 118/82  Pulse: 80  Resp: 18  Temp: 98.1 F (36.7 C)  TempSrc: Oral  SpO2: 98%  Weight: 170 lb (77.1 kg)  Height: 5\' 2"  (1.575 m)      Physical Exam  Constitutional: She is oriented to person, place, and time. She appears well-developed and well-nourished.  Non-toxic appearance. No distress.  Well-appearing female, no acute distress, appears stated age  HENT:  Head: Normocephalic and atraumatic.  Right Ear: External ear normal.  Left Ear: External ear normal.  Nose: Nose normal.  Mouth/Throat: Uvula is midline, oropharynx is clear and moist and mucous membranes are normal. No oropharyngeal exudate.  Eyes: Pupils are equal, round, and reactive to  light. Conjunctivae, EOM and lids are normal. No scleral icterus.  Neck: Normal range of motion and phonation normal. Neck supple. No tracheal deviation present.  Cardiovascular: Normal rate, regular rhythm, normal heart sounds and normal pulses. Exam reveals no gallop and no friction rub.  No murmur heard. Pulses:      Radial pulses are 2+ on the right side, and 2+ on the left side.       Posterior tibial pulses are 2+ on the right side, and 2+ on the left side.  No rub auscultated No lower extremity edema  Pulmonary/Chest: Effort normal and breath sounds normal. No stridor. No respiratory distress. She has no wheezes. She has no rhonchi. She has no rales. She exhibits no tenderness.  Abdominal: Soft. Normal appearance and bowel sounds are normal. She exhibits no distension and no pulsatile midline mass. There is no tenderness. There is no rigidity, no rebound, no guarding and no CVA tenderness.  Firm area palpated to right suprapubic area  Musculoskeletal: Normal range of motion. She exhibits no edema or deformity.       Cervical back: Normal.       Thoracic back: Normal.       Lumbar back: She exhibits tenderness. She exhibits normal range of motion, no bony tenderness, no swelling, no edema, no deformity, no laceration, no pain, no spasm and normal pulse.       Back:  Area of tenderness to palpation outlined   Lymphadenopathy:    She has no cervical adenopathy.  Neurological: She is alert and oriented to person, place, and time. No sensory deficit. She exhibits normal muscle tone. Coordination and gait normal.  Normal steady gait, normal sensation to light touch in bilateral lower extremities, 5/5 bilateral dorsiflexion and plantarflexion  Skin: Skin is warm, dry and intact. Capillary refill takes less than 2 seconds. No rash noted. She is not diaphoretic. No pallor.  Psychiatric: She has a normal mood and affect. Her speech is normal and behavior is normal.  Nursing note and vitals  reviewed.         Assessment & Plan:  ICD-10-CM   1. Right-sided low back pain without sciatica, unspecified chronicity M54.5 cyclobenzaprine (FLEXERIL) 5 MG tablet   rest, heating pad, gentle stretches, muscle relaxers sparingly, and continue naproxen 500 mg BID prn  2. Chronic constipation K59.09 linaclotide (LINZESS) 145 MCG CAPS capsule  3. Uterine leiomyoma, unspecified location D25.9 Ambulatory referral to Obstetrics / Gynecology  4. Pericardial effusion I71.23     52 year old female presents for muscle relaxers for right low back pain, acutely worsened on November 20, 2017 after a passing out episode, pain mild today, right lumbar paraspinal and right SI joint tenderness to palpation but no midline tenderness, no red flags concerning for cauda equina.  She was evaluated in the ER after this fall.  Incidental findings from ER work-up were discussed today including uterine leiomyoma, reviewed CT images, suspect I can palpate fibroids very low suprapubic area on the right side and concerned that these may be worsening her constipation.  She does have CPE in the next couple days with her PCP, but in the meantime I referred her to OB/GYN so she can have them consult and evaluate her for this.  She is postmenopausal.  She has no obstructive symptoms currently.  Also incidental pericardial effusion without any associated symptoms and no exam findings.  Will defer to her PCP for any work-up for this, radiology did suggest outpatient echocardiogram to further evaluate.  She also mentions chronic constipation and requests refill of Linzess.  Advised her to take daily or every other day since she seems to have chronic and moderate - severe constipation.   Delsa Grana, PA-C 11/23/17 11:41 AM

## 2017-11-25 ENCOUNTER — Ambulatory Visit (INDEPENDENT_AMBULATORY_CARE_PROVIDER_SITE_OTHER): Payer: BLUE CROSS/BLUE SHIELD | Admitting: Physician Assistant

## 2017-11-25 ENCOUNTER — Encounter: Payer: Self-pay | Admitting: Physician Assistant

## 2017-11-25 VITALS — BP 98/70 | HR 93 | Temp 98.0°F | Resp 20 | Ht 62.0 in | Wt 169.0 lb

## 2017-11-25 DIAGNOSIS — Z0000 Encounter for general adult medical examination without abnormal findings: Secondary | ICD-10-CM | POA: Diagnosis not present

## 2017-11-25 NOTE — Progress Notes (Signed)
Patient ID: Pamela Nichols MRN: [id number], DOB: 11-14-1965, 52 y.o. Date of Encounter: 11/25/2017,   Chief Complaint: Physical (CPE)  HPI: 52 y.o. y/o female  here for CPE.   She presents for complete physical exam.  Has no specific concerns to address.  Here to update preventive care.   Review of Systems: Consitutional: No fever, chills, fatigue, night sweats, lymphadenopathy. No significant/unexplained weight changes. Eyes: No visual changes, eye redness, or discharge. ENT/Mouth: No ear pain, sore throat, nasal drainage, or sinus pain. Cardiovascular: No chest pressure,heaviness, tightness or squeezing, even with exertion. No increased shortness of breath or dyspnea on exertion.No palpitations, edema, orthopnea, PND. Respiratory: No cough, hemoptysis, SOB, or wheezing. Gastrointestinal: No anorexia, dysphagia, reflux, pain, nausea, vomiting, hematemesis, diarrhea, constipation, BRBPR, or melena. Breast: No mass, nodules, bulging, or retraction. No skin changes or inflammation. No nipple discharge. No lymphadenopathy. Genitourinary: No dysuria, hematuria, incontinence, vaginal discharge, pruritis, burning, abnormal bleeding, or pain. Musculoskeletal: No decreased ROM, No joint pain or swelling. No significant pain in neck, back, or extremities. Skin: No rash, pruritis, or concerning lesions. Neurological: No headache, dizziness, syncope, seizures, tremors, memory loss, coordination problems, or paresthesias. Psychological: No anxiety, depression, hallucinations, SI/HI. Endocrine: No polydipsia, polyphagia, polyuria, or known diabetes.No increased fatigue. No palpitations/rapid heart rate. No significant/unexplained weight change. All other systems were reviewed and are otherwise negative.  Past Medical History:  Diagnosis Date  . Chicken pox   . Vitamin D deficiency      Past Surgical History:  Procedure Laterality Date  . TUBAL LIGATION      Home Meds:  Outpatient  Medications Prior to Visit  Medication Sig Dispense Refill  . aspirin 500 MG tablet Take 500 mg by mouth every 6 (six) hours as needed for pain.    . Cholecalciferol (VITAMIN D) 2000 UNITS CAPS Take 1 capsule by mouth daily.     . cyclobenzaprine (FLEXERIL) 5 MG tablet Take 1-2 tablets (5-10 mg total) by mouth 3 (three) times daily as needed for muscle spasms. 60 tablet 0  . ferrous sulfate 325 (65 FE) MG tablet Take 325 mg by mouth 2 (two) times daily with a meal.    . linaclotide (LINZESS) 145 MCG CAPS capsule Take 1 capsule (145 mcg total) by mouth daily before breakfast. 90 capsule 1  . meloxicam (MOBIC) 15 MG tablet TAKE 1 TABLET BY MOUTH EVERY DAY 30 tablet 0  . naproxen (NAPROSYN) 500 MG tablet Take 1 tablet (500 mg total) by mouth 2 (two) times daily as needed. 12 tablet 0   No facility-administered medications prior to visit.     Allergies: No Known Allergies  Social History   Socioeconomic History  . Marital status: Married    Spouse name: Not on file  . Number of children: 2  . Years of education: 89  . Highest education level: Not on file  Occupational History  . Occupation: Hair Stylist  Social Needs  . Financial resource strain: Not on file  . Food insecurity:    Worry: Not on file    Inability: Not on file  . Transportation needs:    Medical: Not on file    Non-medical: Not on file  Tobacco Use  . Smoking status: Never Smoker  . Smokeless tobacco: Never Used  Substance and Sexual Activity  . Alcohol use: No    Alcohol/week: 0.0 oz  . Drug use: No  . Sexual activity: Not on file  Lifestyle  . Physical activity:    Days  per week: Not on file    Minutes per session: Not on file  . Stress: Not on file  Relationships  . Social connections:    Talks on phone: Not on file    Gets together: Not on file    Attends religious service: Not on file    Active member of club or organization: Not on file    Attends meetings of clubs or organizations: Not on file     Relationship status: Not on file  . Intimate partner violence:    Fear of current or ex partner: Not on file    Emotionally abused: Not on file    Physically abused: Not on file    Forced sexual activity: Not on file  Other Topics Concern  . Not on file  Social History Narrative   [name] grew up in [place]. She is married and lives in [place] with her husband. She works as a Probation officer. She and her husband are raising their nephew that is 66 years old. She has 2 adult children from a previous marriage. Her children live in [place]. She enjoys being outdoors. She also enjoys relaxing and watching TV.    Family History  Problem Relation Age of Onset  . Hypertension Mother   . Mental illness Mother        Depression  . Cancer Father        prostate   . Breast cancer Neg Hx     Physical Exam: Blood pressure 98/70, pulse 93, temperature 98 F (36.7 C), temperature source Oral, resp. rate 20, height 5\' 2"  (1.575 m), weight 76.7 kg (169 lb), last menstrual period 04/02/2014, SpO2 99 %., Body mass index is 30.91 kg/m. General: AAF. Appears in no acute distress. HEENT: Normocephalic, atraumatic. Conjunctiva pink, sclera non-icteric. Pupils 2 mm constricting to 1 mm, round, regular, and equally reactive to light and accomodation. EOMI. Internal auditory canal clear. TMs with good cone of light and without pathology. Nasal mucosa pink. Nares are without discharge. No sinus tenderness. Oral mucosa pink.  Neck: Supple. Trachea midline. No thyromegaly. Full ROM. No lymphadenopathy.No Carotid Bruits. Lungs: Clear to auscultation bilaterally without wheezes, rales, or rhonchi. Breathing is of normal effort and unlabored. Cardiovascular: RRR with S1 S2. No murmurs, rubs, or gallops. Distal pulses 2+ symmetrically. No carotid or abdominal bruits. Breast: Symmetrical. No masses. Nipples without discharge. Abdomen: Soft, non-tender, non-distended with normoactive bowel sounds. No  hepatosplenomegaly or masses. No rebound/guarding. No CVA tenderness. No hernias.  Genitourinary:  External genitalia without lesions. Vaginal mucosa pink.No discharge present. Cervix pink and without discharge. No cervical tenderness.Normal uterus size. No adnexal mass or tenderness.  Pap smear taken Musculoskeletal: Full range of motion and 5/5 strength throughout.  Skin: Warm and moist without erythema, ecchymosis, wounds, or rash. Neuro: A+Ox3. CN II-XII grossly intact. Moves all extremities spontaneously. Full sensation throughout. Normal gait.  Psych:  Responds to questions appropriately with a normal affect.   Assessment/Plan:  52 y.o. y/o female here for CPE  1. Encounter for preventive health examination  A. Screening Labs: 09/09/15 she had full panel of labs including FLP, TSH, CME T, CBC, vitamin D. 11/20/2017 she had CBC and a CME T. She is not fasting today.  Reviewed that lipid panel 08/2015 was excellent with LDL 103.  Discussed this and she can hold off on checking labs.  B. Pap: She reports that she has not had a Pap smear in a long time and wants to update Pap smear  today. - PAP, Thin Prep w/HPV rflx HPV Type 16/18  C. Screening Mammogram: Last mammogram was 05/28/2016 at [facility]. She is overdue for mammogram.  She is agreeable for me to place order for follow-up mammogram to be scheduled. - MM DIGITAL SCREENING BILATERAL; Future  D. DEXA/BMD:  Can wait until closer to age 16 to start bone density scans.  E. Colorectal Cancer Screening: She reports that she had a colonoscopy at age 35 which would have been about 4 years ago which would have been 2015.  Says it was performed in either [facility] or [place].  States that it was normal.  F. Immunizations:  Influenza:----------N/A Tetanus:---------- reports that she knows that she has had a tetanus vaccine recent and that this is up-to-date but has no idea the date that she had this. Pneumococcal:--- She has no  indication to require pneumonia vaccine until age 85. Shingrix: ----------- she is to check with her insurance regarding coverage/cost and if she wants to proceed with Shingrix, will get this at the pharmacy.      Marin Olp Hawi, [place], [facility] 11/25/2017 3:23 PM

## 2017-11-30 LAB — PAP, TP IMAGING W/ HPV RNA, RFLX HPV TYPE 16,18/45: HPV DNA HIGH RISK: NOT DETECTED

## 2017-12-06 ENCOUNTER — Other Ambulatory Visit: Payer: Self-pay

## 2017-12-06 ENCOUNTER — Encounter: Payer: Self-pay | Admitting: Obstetrics & Gynecology

## 2017-12-06 ENCOUNTER — Ambulatory Visit (INDEPENDENT_AMBULATORY_CARE_PROVIDER_SITE_OTHER): Payer: BLUE CROSS/BLUE SHIELD | Admitting: Obstetrics & Gynecology

## 2017-12-06 VITALS — BP 138/86 | HR 80 | Ht 62.0 in | Wt 167.0 lb

## 2017-12-06 DIAGNOSIS — D259 Leiomyoma of uterus, unspecified: Secondary | ICD-10-CM | POA: Diagnosis not present

## 2017-12-06 DIAGNOSIS — R102 Pelvic and perineal pain: Secondary | ICD-10-CM

## 2017-12-06 NOTE — Progress Notes (Signed)
Patient ID: Pamela Nichols, female   DOB: Feb 25, 1966, 52 y.o.   MRN: [id number]      Chief Complaint  Patient presents with  . Fibroids    u/s done 7/6      52 y.o. G3P3 Patient's last menstrual period was 04/02/2014. The current method of family planning is post menopausal status.  Outpatient Encounter Medications as of 12/06/2017  Medication Sig  . aspirin 500 MG tablet Take 500 mg by mouth every 6 (six) hours as needed for pain.  . Cholecalciferol (VITAMIN D) 2000 UNITS CAPS Take 1 capsule by mouth daily.   . cyclobenzaprine (FLEXERIL) 5 MG tablet Take 1-2 tablets (5-10 mg total) by mouth 3 (three) times daily as needed for muscle spasms.  . ferrous sulfate 325 (65 FE) MG tablet Take 325 mg by mouth 2 (two) times daily with a meal.  . meloxicam (MOBIC) 15 MG tablet TAKE 1 TABLET BY MOUTH EVERY DAY  . naproxen (NAPROSYN) 500 MG tablet Take 1 tablet (500 mg total) by mouth 2 (two) times daily as needed.  . linaclotide (LINZESS) 145 MCG CAPS capsule Take 1 capsule (145 mcg total) by mouth daily before breakfast. (Patient not taking: Reported on 12/06/2017)   No facility-administered encounter medications on file as of 12/06/2017.     Subjective Pamela Nichols referred from ED due to abdominal pain Work up included a CT scan which revealed enlarged fibroid uterus with multiple fibroids largest about 7 cm No periods for 1 year Was told 20 years ago or so she had fibroids but have not given her problems until now  No pain with intercourse Past Medical History:  Diagnosis Date  . Chicken pox   . GERD (gastroesophageal reflux disease)   . Vitamin D deficiency     Past Surgical History:  Procedure Laterality Date  . TUBAL LIGATION      OB History    Gravida  3   Para  3   Term      Preterm      AB      Living  2     SAB      TAB      Ectopic      Multiple      Live Births              No Known Allergies  Social History   Socioeconomic  History  . Marital status: Married    Spouse name: Not on file  . Number of children: 2  . Years of education: 41  . Highest education level: Not on file  Occupational History  . Occupation: Hair Stylist  Social Needs  . Financial resource strain: Not on file  . Food insecurity:    Worry: Not on file    Inability: Not on file  . Transportation needs:    Medical: Not on file    Non-medical: Not on file  Tobacco Use  . Smoking status: Never Smoker  . Smokeless tobacco: Never Used  Substance and Sexual Activity  . Alcohol use: No    Alcohol/week: 0.0 oz  . Drug use: No  . Sexual activity: Yes    Birth control/protection: Surgical, Post-menopausal  Lifestyle  . Physical activity:    Days per week: Not on file    Minutes per session: Not on file  . Stress: Not on file  Relationships  . Social connections:    Talks on phone: Not on file    Gets together: Not on  file    Attends religious service: Not on file    Active member of club or organization: Not on file    Attends meetings of clubs or organizations: Not on file    Relationship status: Not on file  Other Topics Concern  . Not on file  Social History Narrative   Erienne grew up in [place]. She is married and lives in [place] with her husband. She works as a Probation officer. She and her husband are raising their nephew that is 60 years old. She has 2 adult children from a previous marriage. Her children live in [place]. She enjoys being outdoors. She also enjoys relaxing and watching TV.    Family History  Problem Relation Age of Onset  . Hypertension Mother   . Mental illness Mother        Depression  . Cancer Father        prostate   . Breast cancer Neg Hx     Medications:       Current Outpatient Medications:  .  aspirin 500 MG tablet, Take 500 mg by mouth every 6 (six) hours as needed for pain., Disp: , Rfl:  .  Cholecalciferol (VITAMIN D) 2000 UNITS CAPS, Take 1 capsule by mouth daily. , Disp:  , Rfl:  .  cyclobenzaprine (FLEXERIL) 5 MG tablet, Take 1-2 tablets (5-10 mg total) by mouth 3 (three) times daily as needed for muscle spasms., Disp: 60 tablet, Rfl: 0 .  ferrous sulfate 325 (65 FE) MG tablet, Take 325 mg by mouth 2 (two) times daily with a meal., Disp: , Rfl:  .  meloxicam (MOBIC) 15 MG tablet, TAKE 1 TABLET BY MOUTH EVERY DAY, Disp: 30 tablet, Rfl: 0 .  naproxen (NAPROSYN) 500 MG tablet, Take 1 tablet (500 mg total) by mouth 2 (two) times daily as needed., Disp: 12 tablet, Rfl: 0 .  linaclotide (LINZESS) 145 MCG CAPS capsule, Take 1 capsule (145 mcg total) by mouth daily before breakfast. (Patient not taking: Reported on 12/06/2017), Disp: 90 capsule, Rfl: 1  Objective Blood pressure 138/86, pulse 80, height 5\' 2"  (1.575 m), weight 167 lb (75.8 kg), last menstrual period 04/02/2014.  General WDWN female NAD Abdomen soft nontender Vulva:  normal appearing vulva with no masses, tenderness or lesions Vagina:  normal mucosa, no discharge Cervix:  no cervical motion tenderness and no lesions Uterus:  18 weeks size multiple fibroids Adnexa: ovaries:present,  normal adnexa in size, nontender and no masses   Pertinent ROS No burning with urination, frequency or urgency No nausea, vomiting or diarrhea Nor fever chills or other constitutional symptoms   Labs or studies Reviewed labs and ct scan reports from ED    Impression Diagnoses this Encounter::   ICD-10-CM   1. Uterine leiomyoma, unspecified location D25.9   2. Pelvic pain R10.2     Established relevant diagnosis(es):   Plan/Recommendations: No orders of the defined types were placed in this encounter.   Labs or Scans Ordered: No orders of the defined types were placed in this encounter.   Management:: Discussed options with patient  Will proceed with abdominal supracervical hysterectomy with BSO 12/29/2017  Follow up Return in about 1 month (around 01/06/2018) for [place], with Dr Elonda Husky.     All  questions were answered.

## 2017-12-24 ENCOUNTER — Other Ambulatory Visit (HOSPITAL_COMMUNITY): Payer: BLUE CROSS/BLUE SHIELD

## 2018-01-06 ENCOUNTER — Encounter: Payer: BLUE CROSS/BLUE SHIELD | Admitting: Obstetrics & Gynecology

## 2018-01-14 ENCOUNTER — Other Ambulatory Visit (HOSPITAL_COMMUNITY): Payer: BLUE CROSS/BLUE SHIELD

## 2018-01-18 NOTE — Patient Instructions (Signed)
Your procedure is scheduled on: 01/26/2018  Report to [facility] at   8:00  AM.  Call this number if you have problems the morning of surgery: [phone]   Remember:   Do not drink or eat food:After Midnight.  :  Take these medicines the morning of surgery with A SIP OF WATER: Flexeril if needed   Do not wear jewelry, make-up or nail polish.  Do not wear lotions, powders, or perfumes. You may wear deodorant.  Do not shave 48 hours prior to surgery. Men may shave face and neck.  Do not bring valuables to the hospital.  Contacts, dentures or bridgework may not be worn into surgery.  Leave suitcase in the car. After surgery it may be brought to your room.  For patients admitted to the hospital, checkout time is 11:00 AM the day of discharge.   Patients discharged the day of surgery will not be allowed to drive home.    Special Instructions: Shower using CHG night before surgery and shower the day of surgery use CHG.  Use special wash - you have one bottle of CHG for all showers.  You should use approximately 1/2 of the bottle for each shower.  Hysterectomy Information A hysterectomy is a surgery to remove your uterus. After surgery, you will no longer have periods. Also, you will not be able to get pregnant. Reasons for this surgery  You have bleeding that is not normal and keeps coming back.  You have lasting (chronic) lower belly (pelvic) pain.  You have a lasting infection.  The lining of your uterus grows outside your uterus.  The lining of your uterus grows in the muscle of your uterus.  Your uterus falls down into your vagina.  You have a growth in your uterus that causes problems.  You have cells that could turn into cancer (precancerous cells).  You have cancer of the uterus or cervix. Types There are 3 types of hysterectomies. Depending on the type, the surgery will:  Remove the top part of the uterus only.  Remove the uterus and the cervix.  Remove the  uterus, cervix, and tissue that holds the uterus in place in the lower belly.  Ways a hysterectomy can be performed There are 5 ways this surgery can be performed.  A cut (incision) is made in the belly (abdomen). The uterus is taken out through the cut.  A cut is made in the vagina. The uterus is taken out through the cut.  Three or four cuts are made in the belly. A surgical device with a camera is put through one of the cuts. The uterus is cut into small pieces. The uterus is taken out through the cuts or the vagina.  Three or four cuts are made in the belly. A surgical device with a camera is put through one of the cuts. The uterus is taken out through the vagina.  Three or four cuts are made in the belly. A surgical device that is controlled by a computer makes a visual image. The device helps the surgeon control the surgical tools. The uterus is cut into small pieces. The pieces are taken out through the cuts or through the vagina.  What can I expect after the surgery?  You will be given pain medicine.  You will need help at home for 3-5 days after surgery.  You will need to see your doctor in 2-4 weeks after surgery.  You may get hot flashes, have night sweats,  and have trouble sleeping.  You may need to have Pap tests in the future if your surgery was related to cancer. Talk to your doctor. It is still good to have regular exams. This information is not intended to replace advice given to you by your health care provider. Make sure you discuss any questions you have with your health care provider. Document Released: 07/27/2011 Document Revised: 10/10/2015 Document Reviewed: 01/09/2013 Elsevier Interactive Patient Education  2018 [employer].  Abdominal Hysterectomy, Care After This sheet gives you information about how to care for yourself after your procedure. Your doctor may also give you more specific instructions. If you have problems or questions, contact your  doctor. Follow these instructions at home: Bathing  Do not take baths, swim, or use a hot tub until your doctor says it is okay. Ask your doctor if you can take showers. You may only be allowed to take sponge baths for bathing.  Keep the bandage (dressing) dry until your doctor says it can be taken off. Surgical cut ( incision) care  Follow instructions from your doctor about how to take care of your cut from surgery. Make sure you: ? Wash your hands with soap and water before you change your bandage (dressing). If you cannot use soap and water, use hand sanitizer. ? Change your bandage as told by your doctor. ? Leave stitches (sutures), skin glue, or skin tape (adhesive) strips in place. They may need to stay in place for 2 weeks or longer. If tape strips get loose and curl up, you may trim the loose edges. Do not remove tape strips completely unless your doctor says it is okay.  Check your surgical cut area every day for signs of infection. Check for: ? Redness, swelling, or pain. ? Fluid or blood. ? Warmth. ? Pus or a bad smell. Activity  Do gentle, daily exercise as told by your doctor. You may be told to take short walks every day and go farther each time.  Do not lift anything that is heavier than 10 lb (4.5 kg), or the limit that your doctor tells you, until he or she says that it is safe.  Do not drive or use heavy machinery while taking prescription pain medicine.  Do not drive for 24 hours if you were given a medicine to help you relax (sedative).  Follow your doctor's advice about exercise, driving, and general activities. Ask your doctor what activities are safe for you. Lifestyle  Do not douche, use tampons, or have sex for at least 6 weeks or as told by your doctor.  Do not drink alcohol until your doctor says it is okay.  Drink enough fluid to keep your pee (urine) clear or pale yellow.  Try to have someone at home with you for the first 1-2 weeks to help.  Do  not use any products that contain nicotine or tobacco, such as cigarettes and e-cigarettes. These can slow down healing. If you need help quitting, ask your doctor. General instructions  Take over-the-counter and prescription medicines only as told by your doctor.  Do not take aspirin or ibuprofen. These medicines can cause bleeding.  To prevent or treat constipation while you are taking prescription pain medicine, your doctor may suggest that you: ? Drink enough fluid to keep your urine clear or pale yellow. ? Take over-the-counter or prescription medicines. ? Eat foods that are high in fiber, such as:  Fresh fruits and vegetables.  Whole grains.  Beans. ? Limit  foods that are high in fat and processed sugars, such as fried and sweet foods.  Keep all follow-up visits as told by your doctor. This is important. Contact a doctor if:  You have chills or fever.  You have redness, swelling, or pain around your cut.  You have fluid or blood coming from your cut.  Your cut feels warm to the touch.  You have pus or a bad smell coming from your cut.  Your cut breaks open.  You feel dizzy or light-headed.  You have pain or bleeding when you pee.  You keep having watery poop (diarrhea).  You keep feeling sick to your stomach (nauseous) or keep throwing up (vomiting).  You have unusual fluid (discharge) coming from your vagina.  You have a rash.  You have a reaction to your medicine.  Your pain medicine does not help. Get help right away if:  You have a fever and your symptoms get worse all of a sudden.  You have very bad belly (abdominal) pain.  You are short of breath.  You pass out (faint).  You have pain, swelling, or redness of your leg.  You bleed a lot from your vagina and notice clumps of blood (clots). Summary  Do not take baths, swim, or use a hot tub until your doctor says it is okay. Ask your doctor if you can take showers. You may only be allowed to  take sponge baths for bathing.  Follow your doctor's advice about exercise, driving, and general activities. Ask your doctor what activities are safe for you.  Do not lift anything that is heavier than 10 lb (4.5 kg), or the limit that your doctor tells you, until he or she says that it is safe.  Try to have someone at home with you for the first 1-2 weeks to help. This information is not intended to replace advice given to you by your health care provider. Make sure you discuss any questions you have with your health care provider. Document Released: 02/11/2008 Document Revised: 04/22/2016 Document Reviewed: 04/22/2016 Elsevier Interactive Patient Education  2017 [place] Anesthesia, Adult, Care After These instructions provide you with information about caring for yourself after your procedure. Your health care provider may also give you more specific instructions. Your treatment has been planned according to current medical practices, but problems sometimes occur. Call your health care provider if you have any problems or questions after your procedure. What can I expect after the procedure? After the procedure, it is common to have:  Vomiting.  A sore throat.  Mental slowness.  It is common to feel:  Nauseous.  Cold or shivery.  Sleepy.  Tired.  Sore or achy, even in parts of your body where you did not have surgery.  Follow these instructions at home: For at least 24 hours after the procedure:  Do not: ? Participate in activities where you could fall or become injured. ? Drive. ? Use heavy machinery. ? Drink alcohol. ? Take sleeping pills or medicines that cause drowsiness. ? Make important decisions or sign legal documents. ? Take care of children on your own.  Rest. Eating and drinking  If you vomit, drink water, juice, or soup when you can drink without vomiting.  Drink enough fluid to keep your urine clear or pale yellow.  Make sure you have  little or no nausea before eating solid foods.  Follow the diet recommended by your health care provider. General instructions  Have a responsible  adult stay with you until you are awake and alert.  Return to your normal activities as told by your health care provider. Ask your health care provider what activities are safe for you.  Take over-the-counter and prescription medicines only as told by your health care provider.  If you smoke, do not smoke without supervision.  Keep all follow-up visits as told by your health care provider. This is important. Contact a health care provider if:  You continue to have nausea or vomiting at home, and medicines are not helpful.  You cannot drink fluids or start eating again.  You cannot urinate after 8-12 hours.  You develop a skin rash.  You have fever.  You have increasing redness at the site of your procedure. Get help right away if:  You have difficulty breathing.  You have chest pain.  You have unexpected bleeding.  You feel that you are having a life-threatening or urgent problem. This information is not intended to replace advice given to you by your health care provider. Make sure you discuss any questions you have with your health care provider. Document Released: 08/10/2000 Document Revised: 10/07/2015 Document Reviewed: 04/18/2015 Elsevier Interactive Patient Education  [employer].

## 2018-01-19 ENCOUNTER — Other Ambulatory Visit: Payer: Self-pay | Admitting: Obstetrics & Gynecology

## 2018-01-20 ENCOUNTER — Other Ambulatory Visit: Payer: Self-pay

## 2018-01-20 ENCOUNTER — Encounter (HOSPITAL_COMMUNITY): Payer: Self-pay

## 2018-01-20 ENCOUNTER — Encounter (HOSPITAL_COMMUNITY)
Admission: RE | Admit: 2018-01-20 | Discharge: 2018-01-20 | Disposition: A | Discharge status: Home / Self Care | Payer: BLUE CROSS/BLUE SHIELD | Source: Ambulatory Visit | Attending: Obstetrics & Gynecology | Admitting: Obstetrics & Gynecology

## 2018-01-20 DIAGNOSIS — Z01812 Encounter for preprocedural laboratory examination: Secondary | ICD-10-CM | POA: Insufficient documentation

## 2018-01-20 HISTORY — DX: Unspecified osteoarthritis, unspecified site: M19.90

## 2018-01-20 LAB — CBC
HCT: 37 % (ref 36.0–46.0)
HEMOGLOBIN: 11.5 g/dL — AB (ref 12.0–15.0)
MCH: 26.2 pg (ref 26.0–34.0)
MCHC: 31.1 g/dL (ref 30.0–36.0)
MCV: 84.3 fL (ref 78.0–100.0)
Platelets: 225 10*3/uL (ref 150–400)
RBC: 4.39 MIL/uL (ref 3.87–5.11)
RDW: 14.9 % (ref 11.5–15.5)
WBC: 5.4 10*3/uL (ref 4.0–10.5)

## 2018-01-20 LAB — RAPID HIV SCREEN (HIV 1/2 AB+AG)
HIV 1/2 Antibodies: NONREACTIVE
HIV-1 P24 Antigen - HIV24: NONREACTIVE

## 2018-01-20 LAB — COMPREHENSIVE METABOLIC PANEL
ALK PHOS: 89 U/L (ref 38–126)
ALT: 15 U/L (ref 0–44)
AST: 16 U/L (ref 15–41)
Albumin: 4 g/dL (ref 3.5–5.0)
Anion gap: 9 (ref 5–15)
BUN: 7 mg/dL (ref 6–20)
CO2: 24 mmol/L (ref 22–32)
Calcium: 9.2 mg/dL (ref 8.9–10.3)
Chloride: 109 mmol/L (ref 98–111)
Creatinine, Ser: 0.63 mg/dL (ref 0.44–1.00)
Glucose, Bld: 94 mg/dL (ref 70–99)
Potassium: 3.9 mmol/L (ref 3.5–5.1)
Sodium: 142 mmol/L (ref 135–145)
Total Bilirubin: 0.9 mg/dL (ref 0.3–1.2)
Total Protein: 7.5 g/dL (ref 6.5–8.1)

## 2018-01-20 LAB — URINALYSIS, ROUTINE W REFLEX MICROSCOPIC
Bilirubin Urine: NEGATIVE
Glucose, UA: NEGATIVE mg/dL
Hgb urine dipstick: NEGATIVE
Ketones, ur: NEGATIVE mg/dL
Nitrite: NEGATIVE
PH: 5 (ref 5.0–8.0)
Protein, ur: NEGATIVE mg/dL
SPECIFIC GRAVITY, URINE: 1.012 (ref 1.005–1.030)

## 2018-01-20 LAB — TYPE AND SCREEN
ABO/RH(D): O POS
Antibody Screen: NEGATIVE

## 2018-01-20 LAB — HCG, QUANTITATIVE, PREGNANCY: HCG, BETA CHAIN, QUANT, S: 2 m[IU]/mL (ref <5)

## 2018-01-26 ENCOUNTER — Inpatient Hospital Stay (HOSPITAL_COMMUNITY): Payer: BLUE CROSS/BLUE SHIELD | Admitting: Anesthesiology

## 2018-01-26 ENCOUNTER — Encounter (HOSPITAL_COMMUNITY): Payer: Self-pay

## 2018-01-26 ENCOUNTER — Encounter (HOSPITAL_COMMUNITY)
Admission: RE | Disposition: A | Discharge status: Home / Self Care | Payer: Self-pay | Source: Ambulatory Visit | Attending: Obstetrics & Gynecology

## 2018-01-26 ENCOUNTER — Inpatient Hospital Stay (HOSPITAL_COMMUNITY)
Admission: RE | Admit: 2018-01-26 | Discharge: 2018-01-27 | DRG: 743 | Disposition: A | Discharge status: Home / Self Care | Payer: BLUE CROSS/BLUE SHIELD | Source: Ambulatory Visit | Attending: Obstetrics & Gynecology | Admitting: Obstetrics & Gynecology

## 2018-01-26 ENCOUNTER — Other Ambulatory Visit: Payer: Self-pay

## 2018-01-26 DIAGNOSIS — I739 Peripheral vascular disease, unspecified: Secondary | ICD-10-CM | POA: Diagnosis present

## 2018-01-26 DIAGNOSIS — D259 Leiomyoma of uterus, unspecified: Secondary | ICD-10-CM | POA: Diagnosis present

## 2018-01-26 DIAGNOSIS — D250 Submucous leiomyoma of uterus: Secondary | ICD-10-CM | POA: Diagnosis not present

## 2018-01-26 DIAGNOSIS — Z90710 Acquired absence of both cervix and uterus: Secondary | ICD-10-CM | POA: Diagnosis present

## 2018-01-26 DIAGNOSIS — R102 Pelvic and perineal pain: Secondary | ICD-10-CM | POA: Diagnosis not present

## 2018-01-26 DIAGNOSIS — R109 Unspecified abdominal pain: Secondary | ICD-10-CM | POA: Diagnosis not present

## 2018-01-26 DIAGNOSIS — D251 Intramural leiomyoma of uterus: Secondary | ICD-10-CM | POA: Diagnosis not present

## 2018-01-26 HISTORY — PX: SUPRACERVICAL ABDOMINAL HYSTERECTOMY: SHX5393

## 2018-01-26 HISTORY — PX: SALPINGOOPHORECTOMY: SHX82

## 2018-01-26 SURGERY — HYSTERECTOMY, SUPRACERVICAL, ABDOMINAL
Anesthesia: General

## 2018-01-26 MED ORDER — FENTANYL CITRATE (PF) 100 MCG/2ML IJ SOLN
25.0000 ug | INTRAMUSCULAR | Status: DC | PRN
  Administered 2018-01-26 (×2): 50 ug via INTRAVENOUS

## 2018-01-26 MED ORDER — DIPHENHYDRAMINE HCL 50 MG/ML IJ SOLN: 25.0000 mg | Freq: Four times a day (QID) | INTRAMUSCULAR | Status: DC | PRN

## 2018-01-26 MED ORDER — PROMETHAZINE HCL 25 MG/ML IJ SOLN: 25.0000 mg | Freq: Four times a day (QID) | INTRAMUSCULAR | Status: DC | PRN

## 2018-01-26 MED ORDER — ROCURONIUM BROMIDE 100 MG/10ML IV SOLN
INTRAVENOUS | Status: DC | PRN
  Administered 2018-01-26: 50 mg via INTRAVENOUS

## 2018-01-26 MED ORDER — LIDOCAINE HCL 1 % IJ SOLN
INTRAMUSCULAR | Status: DC | PRN
  Administered 2018-01-26: 30 mg via INTRADERMAL

## 2018-01-26 MED ORDER — HYDROMORPHONE HCL 1 MG/ML IJ SOLN
0.2500 mg | INTRAMUSCULAR | Status: DC | PRN
  Administered 2018-01-26: 0.5 mg via INTRAVENOUS

## 2018-01-26 MED ORDER — ONDANSETRON HCL 4 MG/2ML IJ SOLN
INTRAMUSCULAR | Status: DC | PRN
  Administered 2018-01-26: 4 mg via INTRAVENOUS

## 2018-01-26 MED ORDER — BISACODYL 10 MG RE SUPP: 10.0000 mg | Freq: Every day | RECTAL | Status: DC | PRN

## 2018-01-26 MED ORDER — MIDAZOLAM HCL 2 MG/2ML IJ SOLN
INTRAMUSCULAR | Status: AC
  Filled 2018-01-26: qty 2

## 2018-01-26 MED ORDER — PROPOFOL 10 MG/ML IV BOLUS
INTRAVENOUS | Status: AC
  Filled 2018-01-26: qty 20

## 2018-01-26 MED ORDER — ATROPINE SULFATE 0.4 MG/ML IJ SOLN
INTRAMUSCULAR | Status: AC
  Filled 2018-01-26: qty 2

## 2018-01-26 MED ORDER — ONDANSETRON HCL 4 MG PO TABS: 8.0000 mg | ORAL_TABLET | Freq: Four times a day (QID) | ORAL | Status: DC | PRN

## 2018-01-26 MED ORDER — LIDOCAINE HCL (PF) 1 % IJ SOLN
INTRAMUSCULAR | Status: AC
  Filled 2018-01-26: qty 5

## 2018-01-26 MED ORDER — DOCUSATE SODIUM 100 MG PO CAPS
100.0000 mg | ORAL_CAPSULE | Freq: Two times a day (BID) | ORAL | Status: DC
  Administered 2018-01-26 – 2018-01-27 (×2): 100 mg via ORAL
  Filled 2018-01-26 (×2): qty 1

## 2018-01-26 MED ORDER — ROCURONIUM BROMIDE 50 MG/5ML IV SOLN
INTRAVENOUS | Status: AC
  Filled 2018-01-26: qty 1

## 2018-01-26 MED ORDER — BUPIVACAINE LIPOSOME 1.3 % IJ SUSP
20.0000 mL | Freq: Once | INTRAMUSCULAR | Status: DC
  Filled 2018-01-26: qty 20

## 2018-01-26 MED ORDER — SODIUM CHLORIDE 0.9 % IV SOLN
8.0000 mg | Freq: Four times a day (QID) | INTRAVENOUS | Status: DC | PRN
  Filled 2018-01-26: qty 4

## 2018-01-26 MED ORDER — SUGAMMADEX SODIUM 200 MG/2ML IV SOLN
INTRAVENOUS | Status: DC | PRN
  Administered 2018-01-26: 200 mg via INTRAVENOUS

## 2018-01-26 MED ORDER — MIDAZOLAM HCL 5 MG/5ML IJ SOLN
INTRAMUSCULAR | Status: DC | PRN
  Administered 2018-01-26: 2 mg via INTRAVENOUS

## 2018-01-26 MED ORDER — FENTANYL CITRATE (PF) 100 MCG/2ML IJ SOLN
INTRAMUSCULAR | Status: AC
  Filled 2018-01-26: qty 2

## 2018-01-26 MED ORDER — HYDROCODONE-ACETAMINOPHEN 7.5-325 MG PO TABS: 1.0000 | ORAL_TABLET | Freq: Once | ORAL | Status: DC | PRN

## 2018-01-26 MED ORDER — KCL IN DEXTROSE-NACL 20-5-0.45 MEQ/L-%-% IV SOLN
INTRAVENOUS | Status: DC
  Administered 2018-01-26: 15:00:00 via INTRAVENOUS

## 2018-01-26 MED ORDER — BUPIVACAINE LIPOSOME 1.3 % IJ SUSP
INTRAMUSCULAR | Status: DC | PRN
  Administered 2018-01-26: 20 mL

## 2018-01-26 MED ORDER — SENNOSIDES-DOCUSATE SODIUM 8.6-50 MG PO TABS: 1.0000 | ORAL_TABLET | Freq: Every evening | ORAL | Status: DC | PRN

## 2018-01-26 MED ORDER — ATROPINE SULFATE 0.4 MG/ML IJ SOLN
INTRAMUSCULAR | Status: DC | PRN
  Administered 2018-01-26: 0.4 mg via INTRAVENOUS

## 2018-01-26 MED ORDER — ONDANSETRON HCL 4 MG/2ML IJ SOLN
INTRAMUSCULAR | Status: AC
  Filled 2018-01-26: qty 2

## 2018-01-26 MED ORDER — KETOROLAC TROMETHAMINE 30 MG/ML IJ SOLN
30.0000 mg | Freq: Once | INTRAMUSCULAR | Status: AC
  Administered 2018-01-26: 30 mg via INTRAVENOUS
  Filled 2018-01-26: qty 1

## 2018-01-26 MED ORDER — HYDROMORPHONE HCL 1 MG/ML IJ SOLN
INTRAMUSCULAR | Status: AC
  Filled 2018-01-26: qty 0.5

## 2018-01-26 MED ORDER — PANTOPRAZOLE SODIUM 40 MG PO TBEC
40.0000 mg | DELAYED_RELEASE_TABLET | Freq: Every day | ORAL | Status: DC
  Administered 2018-01-26 – 2018-01-27 (×2): 40 mg via ORAL
  Filled 2018-01-26 (×2): qty 1

## 2018-01-26 MED ORDER — CEFAZOLIN SODIUM-DEXTROSE 2-4 GM/100ML-% IV SOLN
2.0000 g | INTRAVENOUS | Status: AC
  Administered 2018-01-26: 2 g via INTRAVENOUS
  Filled 2018-01-26: qty 100

## 2018-01-26 MED ORDER — ENOXAPARIN SODIUM 40 MG/0.4ML ~~LOC~~ SOLN
40.0000 mg | SUBCUTANEOUS | Status: DC
  Administered 2018-01-27: 40 mg via SUBCUTANEOUS
  Filled 2018-01-26: qty 0.4

## 2018-01-26 MED ORDER — FENTANYL CITRATE (PF) 100 MCG/2ML IJ SOLN
INTRAMUSCULAR | Status: DC | PRN
  Administered 2018-01-26 (×2): 50 ug via INTRAVENOUS
  Administered 2018-01-26: 100 ug via INTRAVENOUS
  Administered 2018-01-26: 50 ug via INTRAVENOUS

## 2018-01-26 MED ORDER — LACTATED RINGERS IV SOLN
INTRAVENOUS | Status: DC
  Administered 2018-01-26 (×3): via INTRAVENOUS

## 2018-01-26 MED ORDER — OXYCODONE-ACETAMINOPHEN 5-325 MG PO TABS
1.0000 | ORAL_TABLET | ORAL | Status: DC | PRN
  Administered 2018-01-27: 1 via ORAL
  Filled 2018-01-26: qty 1

## 2018-01-26 MED ORDER — 0.9 % SODIUM CHLORIDE (POUR BTL) OPTIME
TOPICAL | Status: DC | PRN
  Administered 2018-01-26: 2000 mL

## 2018-01-26 MED ORDER — KETOROLAC TROMETHAMINE 30 MG/ML IJ SOLN
30.0000 mg | Freq: Four times a day (QID) | INTRAMUSCULAR | Status: AC
  Administered 2018-01-26 – 2018-01-27 (×3): 30 mg via INTRAVENOUS
  Filled 2018-01-26 (×3): qty 1

## 2018-01-26 MED ORDER — FENTANYL CITRATE (PF) 250 MCG/5ML IJ SOLN
INTRAMUSCULAR | Status: AC
  Filled 2018-01-26: qty 5

## 2018-01-26 MED ORDER — HYDROMORPHONE HCL 1 MG/ML IJ SOLN
1.0000 mg | INTRAMUSCULAR | Status: DC | PRN
  Administered 2018-01-26: 2 mg via INTRAVENOUS
  Administered 2018-01-26 (×2): 1 mg via INTRAVENOUS
  Filled 2018-01-26 (×2): qty 1
  Filled 2018-01-26: qty 2

## 2018-01-26 MED ORDER — ALUM & MAG HYDROXIDE-SIMETH 200-200-20 MG/5ML PO SUSP: 30.0000 mL | ORAL | Status: DC | PRN

## 2018-01-26 MED ORDER — SUGAMMADEX SODIUM 200 MG/2ML IV SOLN
INTRAVENOUS | Status: AC
  Filled 2018-01-26: qty 2

## 2018-01-26 MED ORDER — PROPOFOL 10 MG/ML IV BOLUS
INTRAVENOUS | Status: DC | PRN
  Administered 2018-01-26: 150 mg via INTRAVENOUS

## 2018-01-26 SURGICAL SUPPLY — 51 items
APPLIER CLIP 13 LRG OPEN (CLIP)
BLADE SURG SZ10 CARB STEEL (BLADE) ×3 IMPLANT
CELLS DAT CNTRL 66122 CELL SVR (MISCELLANEOUS) IMPLANT
CLIP APPLIE 13 LRG OPEN (CLIP) IMPLANT
CLOTH BEACON ORANGE TIMEOUT ST (SAFETY) ×3 IMPLANT
COVER LIGHT HANDLE STERIS (MISCELLANEOUS) ×6 IMPLANT
DERMABOND ADVANCED (GAUZE/BANDAGES/DRESSINGS) ×1
DERMABOND ADVANCED .7 DNX12 (GAUZE/BANDAGES/DRESSINGS) ×2 IMPLANT
DRAPE WARM FLUID 44X44 (DRAPE) ×3 IMPLANT
DRSG OPSITE POSTOP 4X8 (GAUZE/BANDAGES/DRESSINGS) ×3 IMPLANT
ELECT REM PT RETURN 9FT ADLT (ELECTROSURGICAL) ×3
ELECTRODE REM PT RTRN 9FT ADLT (ELECTROSURGICAL) ×2 IMPLANT
GAUZE SPONGE 4X4 16PLY XRAY LF (GAUZE/BANDAGES/DRESSINGS) ×3 IMPLANT
GLOVE BIOGEL M 6.5 STRL (GLOVE) ×3 IMPLANT
GLOVE BIOGEL PI IND STRL 7.0 (GLOVE) ×4 IMPLANT
GLOVE BIOGEL PI IND STRL 7.5 (GLOVE) ×6 IMPLANT
GLOVE BIOGEL PI IND STRL 8 (GLOVE) ×2 IMPLANT
GLOVE BIOGEL PI INDICATOR 7.0 (GLOVE) ×2
GLOVE BIOGEL PI INDICATOR 7.5 (GLOVE) ×3
GLOVE BIOGEL PI INDICATOR 8 (GLOVE) ×1
GLOVE ECLIPSE 8.0 STRL XLNG CF (GLOVE) ×3 IMPLANT
GOWN STRL REUS W/ TWL LRG LVL3 (GOWN DISPOSABLE) ×4 IMPLANT
GOWN STRL REUS W/TWL LRG LVL3 (GOWN DISPOSABLE) ×8 IMPLANT
GOWN STRL REUS W/TWL XL LVL3 (GOWN DISPOSABLE) ×3 IMPLANT
HEMOSTAT ARISTA ABSORB 3G PWDR (MISCELLANEOUS) IMPLANT
INST SET MAJOR GENERAL (KITS) ×3 IMPLANT
KIT TURNOVER KIT A (KITS) ×3 IMPLANT
MANIFOLD NEPTUNE II (INSTRUMENTS) ×3 IMPLANT
NEEDLE HYPO 21X1.5 SAFETY (NEEDLE) ×3 IMPLANT
NS IRRIG 1000ML POUR BTL (IV SOLUTION) ×6 IMPLANT
PACK ABDOMINAL MAJOR (CUSTOM PROCEDURE TRAY) ×3 IMPLANT
PAD ARMBOARD 7.5X6 YLW CONV (MISCELLANEOUS) ×3 IMPLANT
RETRACTOR WND ALEXIS 25 LRG (MISCELLANEOUS) IMPLANT
RTRCTR WOUND ALEXIS 18CM MED (MISCELLANEOUS)
RTRCTR WOUND ALEXIS 25CM LRG (MISCELLANEOUS)
SET BASIN LINEN APH (SET/KITS/TRAYS/PACK) ×3 IMPLANT
SUT CHROMIC 0 CT 1 (SUTURE) ×3 IMPLANT
SUT MNCRL+ AB 3-0 CT1 36 (SUTURE) IMPLANT
SUT MON AB 3-0 SH 27 (SUTURE) ×3 IMPLANT
SUT MONOCRYL AB 3-0 CT1 36IN (SUTURE)
SUT PLAIN 2 0 XLH (SUTURE) IMPLANT
SUT VIC AB 0 CT1 27 (SUTURE) ×3
SUT VIC AB 0 CT1 27XBRD ANTBC (SUTURE) ×2 IMPLANT
SUT VIC AB 0 CT1 27XCR 8 STRN (SUTURE) ×4 IMPLANT
SUT VIC AB 0 CTX 36 (SUTURE) ×1
SUT VIC AB 0 CTX36XBRD ANTBCTR (SUTURE) ×2 IMPLANT
SUT VICRYL 3 0 (SUTURE) ×3 IMPLANT
SYR 20CC LL (SYRINGE) ×3 IMPLANT
TRAY FOLEY MTR SLVR 16FR STAT (SET/KITS/TRAYS/PACK) ×3 IMPLANT
TRAY FOLEY W/BAG SLVR 16FR (SET/KITS/TRAYS/PACK) ×1
TRAY FOLEY W/BAG SLVR 16FR ST (SET/KITS/TRAYS/PACK) ×2 IMPLANT

## 2018-01-26 NOTE — Significant Event (Signed)
Rapid Response Event Note  Overview:   Called by RN and  charge RN to evaluate pt. RT, AC, Dr. Manuella Ghazi, and undersigned RN responded. RN reported pt had received Dilaudid 2mg  at 2015 for post op pain rated at 8. Respirations had decreased to 7-8 while pt sleeping.      Initial Focused Assessment:  Pt alert, oriented x 4, vitals wnl,  resp 16 while awake and speaking with MD. no resp distress noted. Pt 100% on o2 at 2lpm via n/c     Interventions:     MD ordered to decrease narcotic use and to decrease IVF.  Plan of Care (if not transferred): continue to monitor.   Event Summary:   at  2145 01/26/18    at          [place]

## 2018-01-26 NOTE — Progress Notes (Addendum)
Patient's respirations dropped to 7-8, called RRT to room. Patient awakens easily, O2 sat 100%. Per Dr. Manuella Ghazi, d/c fluids, toradol for pain. Will continue to monitor.

## 2018-01-26 NOTE — H&P (Signed)
Preoperative History and Physical  Pamela Nichols is a 52 y.o. G3P3 with Patient's last menstrual period was 04/02/2014. admitted for a abdominal supracervical hysterectomy with removal of both tubes and ovaries.  18 week size uterus with multiple fibroids Having pelvic abdominal pain Most probably degeneration with menopause causing pain  PMH:    Past Medical History:  Diagnosis Date  . Arthritis    R shoulder  . Chicken pox   . GERD (gastroesophageal reflux disease)   . Vitamin D deficiency     PSH:     Past Surgical History:  Procedure Laterality Date  . TUBAL LIGATION      POb/GynH:      OB History    Gravida  3   Para  3   Term      Preterm      AB      Living  2     SAB      TAB      Ectopic      Multiple      Live Births              SH:   Social History   Tobacco Use  . Smoking status: Never Smoker  . Smokeless tobacco: Never Used  Substance Use Topics  . Alcohol use: No    Alcohol/week: 0.0 standard drinks  . Drug use: No    FH:    Family History  Problem Relation Age of Onset  . Hypertension Mother   . Mental illness Mother        Depression  . Cancer Father        prostate   . Breast cancer Neg Hx      Allergies: No Known Allergies  Medications:       Current Facility-Administered Medications:  .  bupivacaine liposome (EXPAREL) 1.3 % injection 266 mg, 20 mL, Infiltration, Once, Jerri Hargadon H, MD .  ceFAZolin (ANCEF) IVPB 2g/100 mL premix, 2 g, Intravenous, On Call to OR, Florian Buff, MD  Review of Systems:   Review of Systems  Constitutional: Negative for fever, chills, weight loss, malaise/fatigue and diaphoresis.  HENT: Negative for hearing loss, ear pain, nosebleeds, congestion, sore throat, neck pain, tinnitus and ear discharge.   Eyes: Negative for blurred vision, double vision, photophobia, pain, discharge and redness.  Respiratory: Negative for cough, hemoptysis, sputum production, shortness of  breath, wheezing and stridor.   Cardiovascular: Negative for chest pain, palpitations, orthopnea, claudication, leg swelling and PND.  Gastrointestinal: Positive for abdominal pain. Negative for heartburn, nausea, vomiting, diarrhea, constipation, blood in stool and melena.  Genitourinary: Negative for dysuria, urgency, frequency, hematuria and flank pain.  Musculoskeletal: Negative for myalgias, back pain, joint pain and falls.  Skin: Negative for itching and rash.  Neurological: Negative for dizziness, tingling, tremors, sensory change, speech change, focal weakness, seizures, loss of consciousness, weakness and headaches.  Endo/Heme/Allergies: Negative for environmental allergies and polydipsia. Does not bruise/bleed easily.  Psychiatric/Behavioral: Negative for depression, suicidal ideas, hallucinations, memory loss and substance abuse. The patient is not nervous/anxious and does not have insomnia.      PHYSICAL EXAM:  Blood pressure (!) 127/95, pulse 72, temperature 97.9 F (36.6 C), temperature source Oral, resp. rate 16, last menstrual period 04/02/2014, SpO2 100 %.    Vitals reviewed. Constitutional: She is oriented to person, place, and time. She appears well-developed and well-nourished.  HENT:  Head: Normocephalic and atraumatic.  Right Ear: External ear normal.  Left Ear: External  ear normal.  Nose: Nose normal.  Mouth/Throat: Oropharynx is clear and moist.  Eyes: Conjunctivae and EOM are normal. Pupils are equal, round, and reactive to light. Right eye exhibits no discharge. Left eye exhibits no discharge. No scleral icterus.  Neck: Normal range of motion. Neck supple. No tracheal deviation present. No thyromegaly present.  Cardiovascular: Normal rate, regular rhythm, normal heart sounds and intact distal pulses.  Exam reveals no gallop and no friction rub.   No murmur heard. Respiratory: Effort normal and breath sounds normal. No respiratory distress. She has no wheezes.  She has no rales. She exhibits no tenderness.  GI: Soft. Bowel sounds are normal. She exhibits no distension and no mass. There is tenderness. There is no rebound and no guarding.  Genitourinary:       Vulva is normal without lesions Vagina is pink moist without discharge Cervix normal in appearance and pap is normal Uterus is 18 weeks size, multiple fibroids Adnexa is negative with normal sized ovaries by sonogram  Musculoskeletal: Normal range of motion. She exhibits no edema and no tenderness.  Neurological: She is alert and oriented to person, place, and time. She has normal reflexes. She displays normal reflexes. No cranial nerve deficit. She exhibits normal muscle tone. Coordination normal.  Skin: Skin is warm and dry. No rash noted. No erythema. No pallor.  Psychiatric: She has a normal mood and affect. Her behavior is normal. Judgment and thought content normal.    Labs: Results for orders placed or performed during the hospital encounter of 01/20/18 (from the past 336 hour(s))  CBC   Collection Time: 01/20/18  8:22 AM  Result Value Ref Range   WBC 5.4 4.0 - 10.5 K/uL   RBC 4.39 3.87 - 5.11 MIL/uL   Hemoglobin 11.5 (L) 12.0 - 15.0 g/dL   HCT 37.0 36.0 - 46.0 %   MCV 84.3 78.0 - 100.0 fL   MCH 26.2 26.0 - 34.0 pg   MCHC 31.1 30.0 - 36.0 g/dL   RDW 14.9 11.5 - 15.5 %   Platelets 225 150 - 400 K/uL  Comprehensive metabolic panel   Collection Time: 01/20/18  8:22 AM  Result Value Ref Range   Sodium 142 135 - 145 mmol/L   Potassium 3.9 3.5 - 5.1 mmol/L   Chloride 109 98 - 111 mmol/L   CO2 24 22 - 32 mmol/L   Glucose, Bld 94 70 - 99 mg/dL   BUN 7 6 - 20 mg/dL   Creatinine, Ser 0.63 0.44 - 1.00 mg/dL   Calcium 9.2 8.9 - 10.3 mg/dL   Total Protein 7.5 6.5 - 8.1 g/dL   Albumin 4.0 3.5 - 5.0 g/dL   AST 16 15 - 41 U/L   ALT 15 0 - 44 U/L   Alkaline Phosphatase 89 38 - 126 U/L   Total Bilirubin 0.9 0.3 - 1.2 mg/dL   GFR calc non Af Amer >60 >60 mL/min   GFR calc Af Amer >60  >60 mL/min   Anion gap 9 5 - 15  hCG, quantitative, pregnancy   Collection Time: 01/20/18  8:22 AM  Result Value Ref Range   hCG, Beta Chain, Quant, S 2 <5 mIU/mL  Rapid HIV screen (HIV 1/2 Ab+Ag)   Collection Time: 01/20/18  8:22 AM  Result Value Ref Range   HIV-1 P24 Antigen - HIV24 NON REACTIVE NON REACTIVE   HIV 1/2 Antibodies NON REACTIVE NON REACTIVE   Interpretation (HIV Ag Ab)      A non  reactive test result means that HIV 1 or HIV 2 antibodies and HIV 1 p24 antigen were not detected in the specimen.  Urinalysis, Routine w reflex microscopic   Collection Time: 01/20/18  8:22 AM  Result Value Ref Range   Color, Urine YELLOW YELLOW   APPearance CLEAR CLEAR   Specific Gravity, Urine 1.012 1.005 - 1.030   pH 5.0 5.0 - 8.0   Glucose, UA NEGATIVE NEGATIVE mg/dL   Hgb urine dipstick NEGATIVE NEGATIVE   Bilirubin Urine NEGATIVE NEGATIVE   Ketones, ur NEGATIVE NEGATIVE mg/dL   Protein, ur NEGATIVE NEGATIVE mg/dL   Nitrite NEGATIVE NEGATIVE   Leukocytes, UA SMALL (A) NEGATIVE   RBC / HPF 6-10 0 - 5 RBC/hpf   WBC, UA 11-20 0 - 5 WBC/hpf   Bacteria, UA FEW (A) NONE SEEN   Squamous Epithelial / LPF 0-5 0 - 5   Mucus PRESENT    Hyaline Casts, UA PRESENT   Type and screen   Collection Time: 01/20/18  8:22 AM  Result Value Ref Range   ABO/RH(D) O POS    Antibody Screen NEG    Sample Expiration 02/03/2018    Extend sample reason      NO TRANSFUSIONS OR PREGNANCY IN THE PAST 3 MONTHS Performed at [facility], [address] [place],  [address]     EKG: Orders placed or performed during the hospital encounter of 11/20/17  . EKG 12-Lead  . EKG 12-Lead  . EKG 12-Lead  . EKG 12-Lead  . EKG    Imaging Studies: No results found.    Assessment: 18 week fibroid uterus Pelvic and abdominal pain  Patient Active Problem List   Diagnosis Date Noted  . Pericardial effusion 11/23/2017  . Uterine leiomyoma 11/23/2017  . Obese 06/25/2015  . Low back pain 06/25/2015   . Encounter for routine gynecological examination 11/04/2014  . Right shoulder pain 04/09/2014  . Abnormal laboratory test result 10/23/2013  . Skin lesion 10/23/2013  . Abdominal pain, epigastric 10/23/2013  . Constipation 07/10/2013  . Screening for colon cancer 07/10/2013  . Screening for breast cancer 07/10/2013  . Vitamin D deficiency 07/10/2013  . Routine general medical examination at a health care facility 07/10/2013    Plan: Abdominal supracervical hysterectomy with removal of both tubes and ovaries  Pt understands the risks of surgery including but not limited t  excessive bleeding requiring transfusion or reoperation, post-operative infection requiring prolonged hospitalization or re-hospitalization and antibiotic therapy, and damage to other organs including bladder, bowel, ureters and major vessels.  The patient also understands the alternative treatment options which were discussed in full.  All questions were answered.  Florian Buff 01/26/2018 8:41 AM   Florian Buff 01/26/2018 8:41 AM

## 2018-01-26 NOTE — Anesthesia Preprocedure Evaluation (Addendum)
Anesthesia Evaluation  Patient identified by MRN, date of birth, ID band Patient awake    Reviewed: Allergy & Precautions, NPO status , Patient's Chart, lab work & pertinent test results  Airway Mallampati: II  TM Distance: >3 FB Neck ROM: Full    Dental no notable dental hx. (+) Teeth Intact   Pulmonary neg pulmonary ROS,    Pulmonary exam normal breath sounds clear to auscultation       Cardiovascular Exercise Tolerance: Good + Peripheral Vascular Disease  negative cardio ROS Normal cardiovascular examI Rhythm:Regular Rate:Normal     Neuro/Psych negative neurological ROS  negative psych ROS   GI/Hepatic negative GI ROS, Neg liver ROS, GERD  Medicated and Controlled,Only PRN meds-none in about 7 days  Denies any Sx today   Endo/Other  negative endocrine ROS  Renal/GU negative Renal ROS  negative genitourinary   Musculoskeletal negative musculoskeletal ROS (+) Arthritis , Osteoarthritis,  States no flexeril in over 30 days    Abdominal   Peds negative pediatric ROS (+)  Hematology negative hematology ROS (+)   Anesthesia Other Findings   Reproductive/Obstetrics negative OB ROS                             Anesthesia Physical Anesthesia Plan  ASA: II  Anesthesia Plan: General   Post-op Pain Management:    Induction: Intravenous  PONV Risk Score and Plan:   Airway Management Planned: Oral ETT  Additional Equipment:   Intra-op Plan:   Post-operative Plan: Extubation in OR  Informed Consent: I have reviewed the patients History and Physical, chart, labs and discussed the procedure including the risks, benefits and alternatives for the proposed anesthesia with the patient or authorized representative who has indicated his/her understanding and acceptance.   Dental advisory given  Plan Discussed with: CRNA  Anesthesia Plan Comments:         Anesthesia Quick  Evaluation

## 2018-01-26 NOTE — Progress Notes (Signed)
Responded to rapid response that was called at approximately 9:30 PM after nursing staff had noticed that breathing was shallow and with decreased frequency of approximately 7 breaths/min.  Patient denies any complaints or concerns and appears to be slightly somnolent, but otherwise arousable and responsive.  I have advised staff to hold any further narcotic medications for now as it is scheduled simply as needed. Maintain on continuous pulse oximetry overnight.  Continue with Toradol as scheduled.  She is currently on 2 L nasal cannula with clear breath sounds noted and saturation of approximately 98% on pulse oximetry.  Advised to withhold further IV fluid and maintain on clears.  Vitals are otherwise stable.

## 2018-01-26 NOTE — Op Note (Signed)
Preoperative diagnosis:  1.  18 week size fibroid uterus                                          2.  pelvic and abdominal pain                                           Postoperative diagnosis:  Same as above   Procedure:  Abdominal hysterectomy, supracervical with removal of both tubes and ovaries  Surgeon:  Florian Buff  Assistant:    Anesthesia:  General endotracheal  Preoperative clinical summary:  18 week size fibroid uterus with abdominal pelvic pain  Intraoperative findings: same as above  Description of operation:  Patient was taken to the operating room and placed in the supine position where she underwent general endotracheal anesthesia.  She was then prepped and draped in the usual sterile fashion and a Foley catheter was placed for continuous bladder drainage.  A Pfannenstiel skin incision was made and carried down sharply to the rectus fascia which was scored in the midline and extended laterally.  The fascia was taken off the muscles superiorly and inferiorly without difficulty.  The muscles were divided.  The peritoneal cavity was entered. The uterus was delivered through the abdominal incision.   Both uterine cornu were grasped with Coker clamps.  The left round ligament was suture ligated and coagulated with the electrocautery unit.  The left vesicouterine serosal flap was created.  An avascular window in in the peritoneum was created and the left infundibulo pelvic ligament was cross clamped, cut and suture ligated.  The right round ligament was suture ligated and cut with the electrocautery unit.  The vesicouterine serosal flap on the right was created.  An avascular window in the peritoneum was created and the right infudibulo pelvic ligament was cross clamped, cut and double suture ligated.  Thus both ovaries were removed.  The uterine vessels were skeletonized bilaterally.  The uterine vessels were clamped bilaterally,  then cut and suture ligated.  Two more pedicles were  taken down the cervix medial to the uterine vessels.  Each pedicle was clamped cut and suture ligated with good resulting hemostasis.  As per the preoperative plan the cervix was then transected sharply and the specimen was removed.  The cervical stump was then closed anterior to posterior for hemostasis and reduce postoperative adhesions.  The pelvis was irrigated vigorously and all pedicles were examined and found to be hemostatic.  All specimens were sent to pathology for routine evaluation.   The muscles and peritoneum were reapproximated loosely.  The fascia was closed with 0 Vicryl running.   The skin was closed using 3-0 Vicryl on a Keith needle in a subcuticular fashion.  Dermabond was then applied for additional wound integrity and to serve as a postoperative bacterial barrier.  The patient was awakened from anesthesia taken to the recovery room in good stable condition. All sponge instrument and needle counts were correct x 3.  The patient received Ancef and Toradol prophylactically preoperatively.  Estimated blood loss for the procedure was 100  cc.  EURE,LUTHER H 01/26/2018 11:59 AM

## 2018-01-26 NOTE — Transfer of Care (Signed)
Immediate Anesthesia Transfer of Care Note  Patient: Pamela Nichols  Procedure(s) Performed: HYSTERECTOMY SUPRACERVICAL ABDOMINAL (N/A ) BILATERAL SALPINGO OOPHORECTOMY (Bilateral )  Patient Location: PACU  Anesthesia Type:General  Level of Consciousness: awake and patient cooperative  Airway & Oxygen Therapy: Patient Spontanous Breathing and Patient connected to nasal cannula oxygen  Post-op Assessment: Report given to RN, Post -op Vital signs reviewed and stable and Patient moving all extremities  Post vital signs: Reviewed and stable  Last Vitals:  Vitals Value Taken Time  BP    Temp    Pulse 83 01/26/2018 12:10 PM  Resp    SpO2 94 % 01/26/2018 12:10 PM  Vitals shown include unvalidated device data.  Last Pain:  Vitals:   01/26/18 0815  TempSrc: Oral  PainSc: 0-No pain         Complications: No apparent anesthesia complications

## 2018-01-26 NOTE — Anesthesia Procedure Notes (Signed)
Procedure Name: Intubation Date/Time: 01/26/2018 10:24 AM Performed by: Charmaine Downs, CRNA Pre-anesthesia Checklist: Patient identified, Patient being monitored, Timeout performed, Emergency Drugs available and Suction available Patient Re-evaluated:Patient Re-evaluated prior to induction Oxygen Delivery Method: Circle System Utilized Preoxygenation: Pre-oxygenation with 100% oxygen Induction Type: IV induction Ventilation: Mask ventilation without difficulty and Oral airway inserted - appropriate to patient size Laryngoscope Size: Mac and 4 Grade View: Grade I Tube type: Oral Tube size: 7.0 mm Number of attempts: 1 Airway Equipment and Method: stylet Placement Confirmation: ETT inserted through vocal cords under direct vision,  positive ETCO2 and breath sounds checked- equal and bilateral Secured at: 22 cm Tube secured with: Tape Dental Injury: Teeth and Oropharynx as per pre-operative assessment

## 2018-01-26 NOTE — Anesthesia Postprocedure Evaluation (Signed)
Anesthesia Post Note  Patient: Dentist  Procedure(s) Performed: HYSTERECTOMY SUPRACERVICAL ABDOMINAL (N/A ) BILATERAL SALPINGO OOPHORECTOMY (Bilateral )  Patient location during evaluation: PACU Anesthesia Type: General Level of consciousness: awake and patient cooperative Pain management: pain level controlled Vital Signs Assessment: post-procedure vital signs reviewed and stable Respiratory status: spontaneous breathing, nonlabored ventilation and respiratory function stable Cardiovascular status: blood pressure returned to baseline Postop Assessment: no apparent nausea or vomiting Anesthetic complications: no     Last Vitals:  Vitals:   01/26/18 1210 01/26/18 1245  BP:  139/89  Pulse:  75  Resp:  15  Temp: 36.6 C   SpO2:  100%    Last Pain:  Vitals:   01/26/18 1245  TempSrc:   PainSc: Asleep                 Dorice Stiggers J

## 2018-01-27 ENCOUNTER — Encounter: Payer: BLUE CROSS/BLUE SHIELD | Admitting: Obstetrics & Gynecology

## 2018-01-27 LAB — CBC
HCT: 32.3 % — ABNORMAL LOW (ref 36.0–46.0)
Hemoglobin: 10.1 g/dL — ABNORMAL LOW (ref 12.0–15.0)
MCH: 27.2 pg (ref 26.0–34.0)
MCHC: 31.3 g/dL (ref 30.0–36.0)
MCV: 87.1 fL (ref 78.0–100.0)
PLATELETS: 198 10*3/uL (ref 150–400)
RBC: 3.71 MIL/uL — ABNORMAL LOW (ref 3.87–5.11)
RDW: 15.4 % (ref 11.5–15.5)
WBC: 7.8 10*3/uL (ref 4.0–10.5)

## 2018-01-27 LAB — BASIC METABOLIC PANEL
Anion gap: 9 (ref 5–15)
BUN: 8 mg/dL (ref 6–20)
CHLORIDE: 104 mmol/L (ref 98–111)
CO2: 28 mmol/L (ref 22–32)
CREATININE: 0.63 mg/dL (ref 0.44–1.00)
Calcium: 9 mg/dL (ref 8.9–10.3)
GFR calc Af Amer: >60 mL/min (ref >60)
GFR calc non Af Amer: >60 mL/min (ref >60)
GLUCOSE: 113 mg/dL — AB (ref 70–99)
POTASSIUM: 4.4 mmol/L (ref 3.5–5.1)
Sodium: 141 mmol/L (ref 135–145)

## 2018-01-27 MED ORDER — OXYCODONE-ACETAMINOPHEN 5-325 MG PO TABS: 1.0000 | ORAL_TABLET | ORAL | 0 refills | Status: DC | PRN

## 2018-01-27 MED ORDER — KETOROLAC TROMETHAMINE 10 MG PO TABS: 10.0000 mg | ORAL_TABLET | Freq: Three times a day (TID) | ORAL | 0 refills | Status: DC | PRN

## 2018-01-27 MED ORDER — ONDANSETRON HCL 8 MG PO TABS: 8.0000 mg | ORAL_TABLET | Freq: Four times a day (QID) | ORAL | 0 refills | Status: DC | PRN

## 2018-01-27 NOTE — Progress Notes (Signed)
Patient tolerated clear liquids for breakfast and regular diet for lunch with no complaints. Stated pain reduced with Percocet as ordered PRN pain. Ambulated in hallway with standby assist. Voiding independently. Reviewed post-op care and encouraged turning, coughing, deep breathing. Verbalized and demonstrated understanding. Donavan Foil, RN

## 2018-01-27 NOTE — Discharge Summary (Signed)
Physician Discharge Summary  Patient ID: Pamela Nichols MRN: [id number] DOB/AGE: 12-19-1965 52 y.o.  Admit date: 01/26/2018 Discharge date: 01/27/2018  Admission Diagnoses: Fibroids 18 weeks size Discharge Diagnoses:  Active Problems:   S/P hysterectomy   Discharged Condition: good  Hospital Course: unremarkable, normal post op course  Consults: None  Significant Diagnostic Studies: labs:  Results for orders placed or performed during the hospital encounter of 01/26/18 (from the past 24 hour(s))  CBC     Status: Abnormal   Collection Time: 01/27/18  4:43 AM  Result Value Ref Range   WBC 7.8 4.0 - 10.5 K/uL   RBC 3.71 (L) 3.87 - 5.11 MIL/uL   Hemoglobin 10.1 (L) 12.0 - 15.0 g/dL   HCT 32.3 (L) 36.0 - 46.0 %   MCV 87.1 78.0 - 100.0 fL   MCH 27.2 26.0 - 34.0 pg   MCHC 31.3 30.0 - 36.0 g/dL   RDW 15.4 11.5 - 15.5 %   Platelets 198 150 - 400 K/uL  Basic metabolic panel     Status: Abnormal   Collection Time: 01/27/18  4:43 AM  Result Value Ref Range   Sodium 141 135 - 145 mmol/L   Potassium 4.4 3.5 - 5.1 mmol/L   Chloride 104 98 - 111 mmol/L   CO2 28 22 - 32 mmol/L   Glucose, Bld 113 (H) 70 - 99 mg/dL   BUN 8 6 - 20 mg/dL   Creatinine, Ser 0.63 0.44 - 1.00 mg/dL   Calcium 9.0 8.9 - 10.3 mg/dL   GFR calc non Af Amer >60 >60 mL/min   GFR calc Af Amer >60 >60 mL/min   Anion gap 9 5 - 15    Treatments: surgery: abdominal supracervical hysterectomy  Discharge Exam: Blood pressure 124/73, pulse 84, temperature 98.2 F (36.8 C), temperature source Oral, resp. rate 18, height 5\' 2"  (1.575 m), weight 76.2 kg, last menstrual period 04/02/2014, SpO2 100 %. General appearance: alert, cooperative and no distress GI: soft, non-tender; bowel sounds normal; no masses,  no organomegaly Incision/Wound:clean dry intact  Disposition: Discharge disposition: 01-Home or Self Care       Discharge Instructions    Call MD for:  persistant nausea and vomiting   Complete by:  As  directed    Call MD for:  severe uncontrolled pain   Complete by:  As directed    Call MD for:  temperature >100.4   Complete by:  As directed    Diet - low sodium heart healthy   Complete by:  As directed    Driving Restrictions   Complete by:  As directed    No driving for 1 week   Increase activity slowly   Complete by:  As directed    Leave dressing on - Keep it clean, dry, and intact until clinic visit   Complete by:  As directed    Lifting restrictions   Complete by:  As directed    Do not lift more than 10 pounds   Sexual Activity Restrictions   Complete by:  As directed    No sex for 4 weeks      Follow-up Information    Florian Buff, MD Follow up on 02/02/2018.   Specialties:  Obstetrics and Gynecology, Radiology Why:  @10  am post op visit Contact information: [place] [address] [phone]           Signed: Florian Buff 01/27/2018, 2:15 PM

## 2018-01-27 NOTE — Discharge Instructions (Signed)
Abdominal Hysterectomy, Care After °This sheet gives you information about how to care for yourself after your procedure. Your health care provider may also give you more specific instructions. If you have problems or questions, contact your health care provider. °What can I expect after the procedure? °After your procedure, it is common to have: °· Pain. °· Fatigue. °· Poor appetite. °· Less interest in sex. °· Vaginal bleeding and discharge. You may need to use a sanitary napkin after this procedure. ° °Follow these instructions at home: °Bathing °· Do not take baths, swim, or use a hot tub until your health care provider approves. Ask your health care provider if you can take showers. You may only be allowed to take sponge baths for bathing. °· Keep the bandage (dressing) dry until your health care provider says it can be removed. °Incision care °· Follow instructions from your health care provider about how to take care of your incision. Make sure you: °? Wash your hands with soap and water before you change your bandage (dressing). If soap and water are not available, use hand sanitizer. °? Change your dressing as told by your health care provider. °? Leave stitches (sutures), skin glue, or adhesive strips in place. These skin closures may need to stay in place for 2 weeks or longer. If adhesive strip edges start to loosen and curl up, you may trim the loose edges. Do not remove adhesive strips completely unless your health care provider tells you to do that. °· Check your incision area every day for signs of infection. Check for: °? Redness, swelling, or pain. °? Fluid or blood. °? Warmth. °? Pus or a bad smell. °Activity °· Do gentle, daily exercises as told by your health care provider. You may be told to take short walks every day and go farther each time. °· Do not lift anything that is heavier than 10 lb (4.5 kg), or the limit that your health care provider tells you, until he or she says that it is  safe. °· Do not drive or use heavy machinery while taking prescription pain medicine. °· Do not drive for 24 hours if you were given a medicine to help you relax (sedative). °· Follow your health care provider's instructions about exercise, driving, and general activities. Ask your health care provider what activities are safe for you. °Lifestyle °· Do not douche, use tampons, or have sex for at least 6 weeks or as told by your health care provider. °· Do not drink alcohol until your health care provider approves. °· Drink enough fluid to keep your urine clear or pale yellow. °· Try to have someone at home with you for the first 1-2 weeks to help. °· Do not use any products that contain nicotine or tobacco, such as cigarettes and e-cigarettes. These can delay healing. If you need help quitting, ask your health care provider. °General instructions °· Take over-the-counter and prescription medicines only as told by your health care provider. °· Do not take aspirin or ibuprofen. These medicines can cause bleeding. °· To prevent or treat constipation while you are taking prescription pain medicine, your health care provider may recommend that you: °? Drink enough fluid to keep your urine clear or pale yellow. °? Take over-the-counter or prescription medicines. °? Eat foods that are high in fiber, such as fresh fruits and vegetables, whole grains, and beans. °? Limit foods that are high in fat and processed sugars, such as fried and sweet foods. °· Keep all   follow-up visits as told by your health care provider. This is important. °Contact a health care provider if: °· You have chills or fever. °· You have redness, swelling, or pain around your incision. °· You have fluid or blood coming from your incision. °· Your incision feels warm to the touch. °· You have pus or a bad smell coming from your incision. °· Your incision breaks open. °· You feel dizzy or light-headed. °· You have pain or bleeding when you urinate. °· You  have persistent diarrhea. °· You have persistent nausea and vomiting. °· You have abnormal vaginal discharge. °· You have a rash. °· You have any type of abnormal reaction or you develop an allergy to your medicine. °· Your pain medicine does not help. °Get help right away if: °· You have a fever and your symptoms suddenly get worse. °· You have severe abdominal pain. °· You have shortness of breath. °· You faint. °· You have pain, swelling, or redness in your leg. °· You have heavy vaginal bleeding with blood clots. °Summary °· After your procedure, it is common to have pain, fatigue and vaginal discharge. °· Do not take baths, swim, or use a hot tub until your health care provider approves. Ask your health care provider if you can take showers. You may only be allowed to take sponge baths for bathing. °· Follow your health care provider's instructions about exercise, driving, and general activities. Ask your health care provider what activities are safe for you. °· Do not lift anything that is heavier than 10 lb (4.5 kg), or the limit that your health care provider tells you, until he or she says that it is safe. °· Try to have someone at home with you for the first 1-2 weeks to help. °This information is not intended to replace advice given to you by your health care provider. Make sure you discuss any questions you have with your health care provider. °Document Released: 11/21/2004 Document Revised: 04/22/2016 Document Reviewed: 04/22/2016 °Elsevier Interactive Patient Education © 2017 [employer] ° °

## 2018-01-27 NOTE — Addendum Note (Signed)
Addendum  created 01/27/18 1427 by Vista Deck, CRNA   Sign clinical note

## 2018-01-27 NOTE — Progress Notes (Signed)
Discharge instructions reviewed with patient. Given copy of AVS. Prescriptions sent to [facility] by MD. Patient aware to pick them up. [facility] [place], notified Dr. Elonda Husky, stated they should be able to fill [facility] location. IV site d/c'd by nurse tech. Site within normal limits. No complaints at time of discharge. Pt left floor in stable condition via w/c accompanied by nursing staff. Donavan Foil, RN

## 2018-01-27 NOTE — Anesthesia Postprocedure Evaluation (Signed)
Anesthesia Post Note  Patient: Dentist  Procedure(s) Performed: HYSTERECTOMY SUPRACERVICAL ABDOMINAL (N/A ) BILATERAL SALPINGO OOPHORECTOMY (Bilateral )  Patient location during evaluation: Nursing Unit Anesthesia Type: General Level of consciousness: awake and alert and patient cooperative Pain management: satisfactory to patient Vital Signs Assessment: post-procedure vital signs reviewed and stable Respiratory status: spontaneous breathing Cardiovascular status: stable Postop Assessment: no apparent nausea or vomiting and adequate PO intake Anesthetic complications: no     Last Vitals:  Vitals:   01/27/18 0804 01/27/18 1226  BP: 121/75 124/73  Pulse: 66 84  Resp: 16 18  Temp: 36.8 C 36.8 C  SpO2: 100% 100%    Last Pain:  Vitals:   01/27/18 1226  TempSrc: Oral  PainSc: 0-No pain                 Pamela Nichols

## 2018-01-28 ENCOUNTER — Encounter (HOSPITAL_COMMUNITY): Payer: Self-pay | Admitting: Obstetrics & Gynecology

## 2018-02-01 ENCOUNTER — Telehealth: Payer: Self-pay | Admitting: *Deleted

## 2018-02-01 ENCOUNTER — Other Ambulatory Visit: Payer: Self-pay | Admitting: Obstetrics & Gynecology

## 2018-02-01 MED ORDER — OXYCODONE-ACETAMINOPHEN 5-325 MG PO TABS: 1.0000 | ORAL_TABLET | ORAL | 0 refills | Status: DC | PRN

## 2018-02-01 NOTE — Telephone Encounter (Signed)
9/23 is fine I didn't know when her appt was but that is fine  I will eprescribed more pain meds

## 2018-02-01 NOTE — Telephone Encounter (Signed)
Patient informed pain medication will be refilled and ok to just keep 9/23 appt. Verbalized understanding.

## 2018-02-01 NOTE — Telephone Encounter (Signed)
Patient called requesting a refill on her pain medication.  States she has 4 Percocet left and is still in some pain.  She also says she is to come tomorrow at 10am.  Do we need to put her on your schedule and cancel the 9/23 appt? Please advise.

## 2018-02-02 ENCOUNTER — Telehealth: Payer: Self-pay | Admitting: Obstetrics & Gynecology

## 2018-02-03 ENCOUNTER — Telehealth: Payer: Self-pay | Admitting: *Deleted

## 2018-02-03 NOTE — Telephone Encounter (Signed)
Patient informed that Dr Elonda Husky was out of town but could take 600-800mg  now if needed along with the Percocet.  Verbalized understanding.

## 2018-02-07 ENCOUNTER — Ambulatory Visit (INDEPENDENT_AMBULATORY_CARE_PROVIDER_SITE_OTHER): Payer: BLUE CROSS/BLUE SHIELD | Admitting: Obstetrics & Gynecology

## 2018-02-07 ENCOUNTER — Other Ambulatory Visit: Payer: Self-pay

## 2018-02-07 ENCOUNTER — Encounter: Payer: Self-pay | Admitting: Obstetrics & Gynecology

## 2018-02-07 VITALS — BP 131/88 | HR 90 | Ht 62.0 in | Wt 164.0 lb

## 2018-02-07 DIAGNOSIS — Z90710 Acquired absence of both cervix and uterus: Secondary | ICD-10-CM

## 2018-02-07 MED ORDER — ESTRADIOL 2 MG PO TABS: 2.0000 mg | ORAL_TABLET | Freq: Every day | ORAL | 11 refills | Status: DC

## 2018-02-07 MED ORDER — KETOROLAC TROMETHAMINE 10 MG PO TABS: 10.0000 mg | ORAL_TABLET | Freq: Three times a day (TID) | ORAL | 0 refills | Status: DC | PRN

## 2018-02-07 NOTE — Progress Notes (Signed)
   HPI: Patient returns for routine postoperative follow-up having undergone supracervical abdominal hysterectomy with removal of both tubes and ovaries on 01/26/2018.  The patient's immediate postoperative recovery has been unremarkable. Since hospital discharge the patient reports no problems, return of normal bowel and bladder function.   Current Outpatient Medications: aspirin 325 MG EC tablet, Take 650 mg by mouth daily as needed for pain., Disp: , Rfl:  oxyCODONE-acetaminophen (PERCOCET/ROXICET) 5-325 MG tablet, Take 1 tablet by mouth every 4 (four) hours as needed for moderate pain ((when tolerating fluids))., Disp: 30 tablet, Rfl: 0  No current facility-administered medications for this visit.     Blood pressure 131/88, pulse 90, height 5\' 2"  (1.575 m), weight 164 lb (74.4 kg), last menstrual period 04/02/2014.  Physical Exam: Incision clean dry intact Abdomen soft non tender benign normal post op  Diagnostic Tests:   Pathology: benign  Impression: S/p abdominla supracervical hysterectomy  Plan: Having increase hot flashes  Meds ordered this encounter  Medications  . estradiol (ESTRACE) 2 MG tablet    Sig: Take 1 tablet (2 mg total) by mouth daily.    Dispense:  30 tablet    Refill:  11  . ketorolac (TORADOL) 10 MG tablet    Sig: Take 1 tablet (10 mg total) by mouth every 8 (eight) hours as needed.    Dispense:  15 tablet    Refill:  0    Follow up: 4  weeks  Florian Buff, MD

## 2018-02-09 NOTE — Telephone Encounter (Signed)
Patient states she feels like she has a knot at her incision.  Informed patient it sounded like scar tissue but could have Dr Elonda Husky look at it at her next visit.  Verbalized understanding.

## 2018-02-14 ENCOUNTER — Other Ambulatory Visit: Payer: Self-pay | Admitting: Obstetrics & Gynecology

## 2018-02-28 ENCOUNTER — Ambulatory Visit (HOSPITAL_COMMUNITY): Payer: BLUE CROSS/BLUE SHIELD

## 2018-03-01 ENCOUNTER — Other Ambulatory Visit: Payer: Self-pay | Admitting: Obstetrics & Gynecology

## 2018-03-07 ENCOUNTER — Encounter: Payer: Self-pay | Admitting: Obstetrics & Gynecology

## 2018-03-07 ENCOUNTER — Ambulatory Visit (INDEPENDENT_AMBULATORY_CARE_PROVIDER_SITE_OTHER): Payer: BLUE CROSS/BLUE SHIELD | Admitting: Obstetrics & Gynecology

## 2018-03-07 VITALS — BP 112/79 | HR 91 | Ht 62.0 in | Wt 164.5 lb

## 2018-03-07 DIAGNOSIS — Z90710 Acquired absence of both cervix and uterus: Secondary | ICD-10-CM

## 2018-03-07 NOTE — Progress Notes (Signed)
  HPI: Patient returns for routine postoperative follow-up having undergone abdominal supracervical hysterectomy with BSO on 01/26/2018.  The patient's immediate postoperative recovery has been unremarkable. Since hospital discharge the patient reports no problems.   Current Outpatient Medications: estradiol (ESTRACE) 2 MG tablet, TAKE 1 TABLET BY MOUTH EVERY DAY, Disp: 90 tablet, Rfl: 4 ketorolac (TORADOL) 10 MG tablet, TAKE 1 TABLET (10 MG TOTAL) BY MOUTH EVERY 8 (EIGHT) HOURS AS NEEDED., Disp: 15 tablet, Rfl: 0 aspirin 325 MG EC tablet, Take 650 mg by mouth daily as needed for pain., Disp: , Rfl:  oxyCODONE-acetaminophen (PERCOCET/ROXICET) 5-325 MG tablet, Take 1 tablet by mouth every 4 (four) hours as needed for moderate pain ((when tolerating fluids)). (Patient not taking: Reported on 03/07/2018), Disp: 30 tablet, Rfl: 0  No current facility-administered medications for this visit.     Blood pressure 112/79, pulse 91, height 5\' 2"  (1.575 m), weight 164 lb 8 oz (74.6 kg), last menstrual period 04/02/2014.  Physical Exam: Incision clean dry intact Cervix normal post op  Diagnostic Tests:   Pathology: benign  Impression: S/p hystrectomy with normal post op course  Plan:   Follow up: 1  years  Florian Buff, MD

## 2018-06-03 ENCOUNTER — Encounter (HOSPITAL_COMMUNITY): Payer: Self-pay | Admitting: Emergency Medicine

## 2018-06-03 ENCOUNTER — Emergency Department (HOSPITAL_COMMUNITY)
Admission: EM | Admit: 2018-06-03 | Discharge: 2018-06-04 | Disposition: A | Discharge status: Home / Self Care | Payer: BLUE CROSS/BLUE SHIELD | Attending: Emergency Medicine | Admitting: Emergency Medicine

## 2018-06-03 DIAGNOSIS — S39012A Strain of muscle, fascia and tendon of lower back, initial encounter: Secondary | ICD-10-CM | POA: Diagnosis not present

## 2018-06-03 DIAGNOSIS — Y93B9 Activity, other involving muscle strengthening exercises: Secondary | ICD-10-CM | POA: Insufficient documentation

## 2018-06-03 DIAGNOSIS — Y929 Unspecified place or not applicable: Secondary | ICD-10-CM | POA: Diagnosis not present

## 2018-06-03 DIAGNOSIS — X509XXA Other and unspecified overexertion or strenuous movements or postures, initial encounter: Secondary | ICD-10-CM | POA: Insufficient documentation

## 2018-06-03 DIAGNOSIS — Z79899 Other long term (current) drug therapy: Secondary | ICD-10-CM | POA: Diagnosis not present

## 2018-06-03 DIAGNOSIS — Y999 Unspecified external cause status: Secondary | ICD-10-CM | POA: Diagnosis not present

## 2018-06-03 DIAGNOSIS — S3992XA Unspecified injury of lower back, initial encounter: Secondary | ICD-10-CM | POA: Diagnosis present

## 2018-06-03 LAB — URINALYSIS, ROUTINE W REFLEX MICROSCOPIC
Bilirubin Urine: NEGATIVE
Glucose, UA: NEGATIVE mg/dL
Hgb urine dipstick: NEGATIVE
Ketones, ur: NEGATIVE mg/dL
LEUKOCYTES UA: NEGATIVE
Nitrite: NEGATIVE
PROTEIN: NEGATIVE mg/dL
SPECIFIC GRAVITY, URINE: 1.013 (ref 1.005–1.030)
pH: 5 (ref 5.0–8.0)

## 2018-06-04 MED ORDER — CYCLOBENZAPRINE HCL 10 MG PO TABS: 5.0000 mg | ORAL_TABLET | Freq: Every day | ORAL | 0 refills | Status: AC

## 2018-06-04 MED ORDER — CYCLOBENZAPRINE HCL 10 MG PO TABS
5.0000 mg | ORAL_TABLET | Freq: Once | ORAL | Status: AC
  Administered 2018-06-04: 5 mg via ORAL
  Filled 2018-06-04: qty 1

## 2018-06-04 MED ORDER — KETOROLAC TROMETHAMINE 60 MG/2ML IM SOLN
30.0000 mg | Freq: Once | INTRAMUSCULAR | Status: AC
  Administered 2018-06-04: 30 mg via INTRAMUSCULAR
  Filled 2018-06-04: qty 2

## 2018-06-04 NOTE — Discharge Instructions (Signed)
You may use over-the-counter Aleve (Naproxen) or Motrin (Ibuprofen), Acetaminophen (Tylenol), topical muscle creams such as SalonPas, [employer], Bengay, etc. Please stretch, apply ice or heat (whichever helps), and have massage therapy for additional assistance.

## 2018-06-04 NOTE — ED Provider Notes (Signed)
[facility] EMERGENCY DEPARTMENT Provider Note  CSN: [id number] Arrival date & time: 06/03/18 2237  Chief Complaint(s) Back Pain  HPI Pamela Nichols is a 53 y.o. female   The history is provided by the patient.  Back Pain  Location:  Sacro-iliac joint Quality:  Stabbing and aching Radiates to:  Does not radiate Pain severity:  Severe Onset quality:  Gradual Duration:  2 days Timing:  Constant Progression:  Waxing and waning Chronicity:  New Context: physical stress and twisting   Context comment:  Started working out again Relieved by:  Narcotics and lying down Worsened by:  Twisting and movement Associated symptoms: no dysuria, no numbness, no paresthesias, no perianal numbness and no tingling     Past Medical History Past Medical History:  Diagnosis Date  . Arthritis    R shoulder  . Chicken pox   . GERD (gastroesophageal reflux disease)   . Vitamin D deficiency    Patient Active Problem List   Diagnosis Date Noted  . S/P hysterectomy 01/26/2018  . Pericardial effusion 11/23/2017  . Uterine leiomyoma 11/23/2017  . Obese 06/25/2015  . Low back pain 06/25/2015  . Encounter for routine gynecological examination 11/04/2014  . Right shoulder pain 04/09/2014  . Abnormal laboratory test result 10/23/2013  . Skin lesion 10/23/2013  . Abdominal pain, epigastric 10/23/2013  . Constipation 07/10/2013  . Screening for colon cancer 07/10/2013  . Screening for breast cancer 07/10/2013  . Vitamin D deficiency 07/10/2013  . Routine general medical examination at a health care facility 07/10/2013   Home Medication(s) Prior to Admission medications   Medication Sig Start Date End Date Taking? Authorizing Provider  aspirin 325 MG EC tablet Take 650 mg by mouth daily as needed for pain.    [provider]  cyclobenzaprine (FLEXERIL) 10 MG tablet Take 0.5-1 tablets (5-10 mg total) by mouth at bedtime for 10 days. 06/04/18 06/14/18  Fatima Blank, MD  estradiol (ESTRACE) 2 MG tablet TAKE 1 TABLET BY MOUTH EVERY DAY 03/01/18   Florian Buff, MD  ketorolac (TORADOL) 10 MG tablet TAKE 1 TABLET (10 MG TOTAL) BY MOUTH EVERY 8 (EIGHT) HOURS AS NEEDED. 02/15/18   Florian Buff, MD  oxyCODONE-acetaminophen (PERCOCET/ROXICET) 5-325 MG tablet Take 1 tablet by mouth every 4 (four) hours as needed for moderate pain ((when tolerating fluids)). Patient not taking: Reported on 03/07/2018 02/01/18   Florian Buff, MD                                                                                                                                    Past Surgical History Past Surgical History:  Procedure Laterality Date  . SALPINGOOPHORECTOMY Bilateral 01/26/2018   Procedure: BILATERAL SALPINGO OOPHORECTOMY;  Surgeon: Florian Buff, MD;  Location: [facility];  Service: Gynecology;  Laterality: Bilateral;  . SUPRACERVICAL ABDOMINAL HYSTERECTOMY N/A 01/26/2018   Procedure: HYSTERECTOMY SUPRACERVICAL ABDOMINAL;  Surgeon:  Florian Buff, MD;  Location: [facility];  Service: Gynecology;  Laterality: N/A;  . TUBAL LIGATION     Family History Family History  Problem Relation Age of Onset  . Hypertension Mother   . Mental illness Mother        Depression  . Cancer Father        prostate   . Breast cancer Neg Hx     Social History Social History   Tobacco Use  . Smoking status: Never Smoker  . Smokeless tobacco: Never Used  Substance Use Topics  . Alcohol use: No    Alcohol/week: 0.0 standard drinks  . Drug use: No   Allergies Patient has no known allergies.  Review of Systems Review of Systems  Genitourinary: Negative for dysuria.  Musculoskeletal: Positive for back pain.  Neurological: Negative for tingling, numbness and paresthesias.   All other systems are reviewed and are negative for acute change except as noted in the HPI  Physical Exam Vital Signs  I have reviewed the triage vital signs BP 134/82   Pulse (!) 55   Temp 98.3 F  (36.8 C) (Oral)   Resp 18   LMP 04/02/2014   SpO2 100%   Physical Exam Vitals signs reviewed.  Constitutional:      General: She is not in acute distress.    Appearance: She is well-developed. She is not diaphoretic.  HENT:     Head: Normocephalic and atraumatic.     Right Ear: External ear normal.     Left Ear: External ear normal.     Nose: Nose normal.  Eyes:     General: No scleral icterus.    Conjunctiva/sclera: Conjunctivae normal.  Neck:     Musculoskeletal: Normal range of motion.     Trachea: Phonation normal.  Cardiovascular:     Rate and Rhythm: Normal rate and regular rhythm.  Pulmonary:     Effort: Pulmonary effort is normal. No respiratory distress.     Breath sounds: No stridor.  Abdominal:     General: There is no distension.  Musculoskeletal: Normal range of motion.     Lumbar back: She exhibits tenderness, pain and spasm. She exhibits no bony tenderness.       Back:  Neurological:     Mental Status: She is alert and oriented to person, place, and time.     Comments: Spine Exam: Strength: 5/5 throughout LE bilaterally (hip flexion/extension, adduction/abduction; knee flexion/extension; foot dorsiflexion/plantarflexion, inversion/eversion; great toe inversion) Sensation: Intact to light touch in proximal and distal LE bilaterally Reflexes: 1+ quadriceps and achilles reflexes   Psychiatric:        Behavior: Behavior normal.     ED Results and Treatments Labs (all labs ordered are listed, but only abnormal results are displayed) Labs Reviewed  URINALYSIS, ROUTINE W REFLEX MICROSCOPIC - Abnormal; Notable for the following components:      Result Value   Color, Urine STRAW (*)    All other components within normal limits  EKG  EKG Interpretation  Date/Time:    Ventricular Rate:    PR Interval:    QRS Duration:   QT Interval:      QTC Calculation:   R Axis:     Text Interpretation:        Radiology No results found. Pertinent labs & imaging results that were available during my care of the patient were reviewed by me and considered in my medical decision making (see chart for details).  Medications Ordered in ED Medications  ketorolac (TORADOL) injection 30 mg (30 mg Intramuscular Given 06/04/18 0449)  cyclobenzaprine (FLEXERIL) tablet 5 mg (5 mg Oral Given 06/04/18 0450)                                                                                                                                    Procedures Procedures  (including critical care time)  Medical Decision Making / ED Course I have reviewed the nursing notes for this encounter and the patient's prior records (if available in EHR or on provided paperwork).    53 y.o. female presents with back pain in lumbar area for 2days without signs of radicular pain. No acute traumatic onset. No red flag symptoms of fever, weight loss, saddle anesthesia, weakness, fecal/urinary incontinence or urinary retention.   Suspect MSK etiology. No indication for imaging emergently. Patient was recommended to take short course of scheduled NSAIDs and engage in early mobility as definitive treatment. Return precautions discussed for worsening or new concerning symptoms.   The patient is safe for discharge with strict return precautions.  Final Clinical Impression(s) / ED Diagnoses Final diagnoses:  Strain of lumbar region, initial encounter    Disposition: Discharge  Condition: Good  I have discussed the results, Dx and Tx plan with the patient who expressed understanding and agree(s) with the plan. Discharge instructions discussed at great length. The patient was given strict return precautions who verbalized understanding of the instructions. No further questions at time of discharge.    ED Discharge Orders         Ordered    cyclobenzaprine (FLEXERIL) 10  MG tablet  Daily at bedtime     06/04/18 0455           Follow Up: Orlena Sheldon, PA-C [address] [place] [address] [phone]  Schedule an appointment as soon as possible for a visit  in 1-2 weeks, If symptoms do not improve or  worsen     This chart was dictated using voice recognition software.  Despite best efforts to proofread,  errors can occur which can change the documentation meaning.   Fatima Blank, MD 06/04/18 [phone]

## 2018-12-05 ENCOUNTER — Encounter: Payer: BLUE CROSS/BLUE SHIELD | Admitting: Physician Assistant

## 2018-12-05 ENCOUNTER — Other Ambulatory Visit: Payer: Self-pay

## 2018-12-06 ENCOUNTER — Ambulatory Visit (INDEPENDENT_AMBULATORY_CARE_PROVIDER_SITE_OTHER): Payer: BC Managed Care – PPO | Admitting: Family Medicine

## 2018-12-06 ENCOUNTER — Encounter: Payer: Self-pay | Admitting: Family Medicine

## 2018-12-06 VITALS — BP 130/88 | HR 70 | Temp 97.9°F | Resp 18 | Ht 62.0 in | Wt 183.0 lb

## 2018-12-06 DIAGNOSIS — E559 Vitamin D deficiency, unspecified: Secondary | ICD-10-CM

## 2018-12-06 DIAGNOSIS — Z0001 Encounter for general adult medical examination with abnormal findings: Secondary | ICD-10-CM | POA: Diagnosis not present

## 2018-12-06 DIAGNOSIS — Z1159 Encounter for screening for other viral diseases: Secondary | ICD-10-CM

## 2018-12-06 DIAGNOSIS — Z6833 Body mass index (BMI) 33.0-33.9, adult: Secondary | ICD-10-CM

## 2018-12-06 DIAGNOSIS — Z0000 Encounter for general adult medical examination without abnormal findings: Secondary | ICD-10-CM

## 2018-12-06 DIAGNOSIS — Z23 Encounter for immunization: Secondary | ICD-10-CM

## 2018-12-06 DIAGNOSIS — D649 Anemia, unspecified: Secondary | ICD-10-CM

## 2018-12-06 DIAGNOSIS — K5909 Other constipation: Secondary | ICD-10-CM

## 2018-12-06 DIAGNOSIS — Z1239 Encounter for other screening for malignant neoplasm of breast: Secondary | ICD-10-CM | POA: Diagnosis not present

## 2018-12-06 DIAGNOSIS — E669 Obesity, unspecified: Secondary | ICD-10-CM

## 2018-12-06 MED ORDER — LINACLOTIDE 145 MCG PO CAPS: 145.0000 ug | ORAL_CAPSULE | Freq: Every day | ORAL | 1 refills | Status: DC

## 2018-12-06 NOTE — Patient Instructions (Addendum)
Health Maintenance  Topic Date Due  . Hepatitis C Screening  Oct 30, 1965  . TETANUS/TDAP  08/19/1982  . MAMMOGRAM  05/28/2017  . INFLUENZA VACCINE  12/17/2018  . PAP SMEAR-Modifier  11/25/2020  . COLONOSCOPY  09/09/2023  . HIV Screening  Completed   Return any day for tetanus booster  Constipation, Adult Constipation is when a person:  Poops (has a bowel movement) fewer times in a week than normal.  Has a hard time pooping.  Has poop that is dry, hard, or bigger than normal. Follow these instructions at home: Eating and drinking   Eat foods that have a lot of fiber, such as: ? Fresh fruits and vegetables. ? Whole grains. ? Beans.  Eat less of foods that are high in fat, low in fiber, or overly processed, such as: ? [place] fries. ? Hamburgers. ? Cookies. ? Candy. ? Soda.  Drink enough fluid to keep your pee (urine) clear or pale yellow. General instructions  Exercise regularly or as told by your doctor.  Go to the restroom when you feel like you need to poop. Do not hold it in.  Take over-the-counter and prescription medicines only as told by your doctor. These include any fiber supplements.  Do pelvic floor retraining exercises, such as: ? Doing deep breathing while relaxing your lower belly (abdomen). ? Relaxing your pelvic floor while pooping.  Watch your condition for any changes.  Keep all follow-up visits as told by your doctor. This is important. Contact a doctor if:  You have pain that gets worse.  You have a fever.  You have not pooped for 4 days.  You throw up (vomit).  You are not hungry.  You lose weight.  You are bleeding from the anus.  You have thin, pencil-like poop (stool). Get help right away if:  You have a fever, and your symptoms suddenly get worse.  You leak poop or have blood in your poop.  Your belly feels hard or bigger than normal (is bloated).  You have very bad belly pain.  You feel dizzy or you faint. This  information is not intended to replace advice given to you by your health care provider. Make sure you discuss any questions you have with your health care provider. Document Released: 10/21/2007 Document Revised: 04/16/2017 Document Reviewed: 10/23/2015 Elsevier Patient Education  2020 [place] Maintenance, Female Adopting a healthy lifestyle and getting preventive care are important in promoting health and wellness. Ask your health care provider about:  The right schedule for you to have regular tests and exams.  Things you can do on your own to prevent diseases and keep yourself healthy. What should I know about diet, weight, and exercise? Eat a healthy diet   Eat a diet that includes plenty of vegetables, fruits, low-fat dairy products, and lean protein.  Do not eat a lot of foods that are high in solid fats, added sugars, or sodium. Maintain a healthy weight Body mass index (BMI) is used to identify weight problems. It estimates body fat based on height and weight. Your health care provider can help determine your BMI and help you achieve or maintain a healthy weight. Get regular exercise Get regular exercise. This is one of the most important things you can do for your health. Most adults should:  Exercise for at least 150 minutes each week. The exercise should increase your heart rate and make you sweat (moderate-intensity exercise).  Do strengthening exercises at least twice a week.  This is in addition to the moderate-intensity exercise.  Spend less time sitting. Even light physical activity can be beneficial. Watch cholesterol and blood lipids Have your blood tested for lipids and cholesterol at 55 years of age, then have this test every 5 years. Have your cholesterol levels checked more often if:  Your lipid or cholesterol levels are high.  You are older than 55 years of age.  You are at high risk for heart disease. What should I know about cancer  screening? Depending on your health history and family history, you may need to have cancer screening at various ages. This may include screening for:  Breast cancer.  Cervical cancer.  Colorectal cancer.  Skin cancer.  Lung cancer. What should I know about heart disease, diabetes, and high blood pressure? Blood pressure and heart disease  High blood pressure causes heart disease and increases the risk of stroke. This is more likely to develop in people who have high blood pressure readings, are of African descent, or are overweight.  Have your blood pressure checked: ? Every 3-5 years if you are 15-43 years of age. ? Every year if you are 79 years old or older. Diabetes Have regular diabetes screenings. This checks your fasting blood sugar level. Have the screening done:  Once every three years after age 76 if you are at a normal weight and have a low risk for diabetes.  More often and at a younger age if you are overweight or have a high risk for diabetes. What should I know about preventing infection? Hepatitis B If you have a higher risk for hepatitis B, you should be screened for this virus. Talk with your health care provider to find out if you are at risk for hepatitis B infection. Hepatitis C Testing is recommended for:  Everyone born from [birth year] through 1967.  Anyone with known risk factors for hepatitis C. Sexually transmitted infections (STIs)  Get screened for STIs, including gonorrhea and chlamydia, if: ? You are sexually active and are younger than 55 years of age. ? You are older than 55 years of age and your health care provider tells you that you are at risk for this type of infection. ? Your sexual activity has changed since you were last screened, and you are at increased risk for chlamydia or gonorrhea. Ask your health care provider if you are at risk.  Ask your health care provider about whether you are at high risk for HIV. Your health care provider may  recommend a prescription medicine to help prevent HIV infection. If you choose to take medicine to prevent HIV, you should first get tested for HIV. You should then be tested every 3 months for as long as you are taking the medicine. Pregnancy  If you are about to stop having your period (premenopausal) and you may become pregnant, seek counseling before you get pregnant.  Take 400 to 800 micrograms (mcg) of folic acid every day if you become pregnant.  Ask for birth control (contraception) if you want to prevent pregnancy. Osteoporosis and menopause Osteoporosis is a disease in which the bones lose minerals and strength with aging. This can result in bone fractures. If you are 71 years old or older, or if you are at risk for osteoporosis and fractures, ask your health care provider if you should:  Be screened for bone loss.  Take a calcium or vitamin D supplement to lower your risk of fractures.  Be given hormone replacement therapy (HRT)  to treat symptoms of menopause. Follow these instructions at home: Lifestyle  Do not use any products that contain nicotine or tobacco, such as cigarettes, e-cigarettes, and chewing tobacco. If you need help quitting, ask your health care provider.  Do not use street drugs.  Do not share needles.  Ask your health care provider for help if you need support or information about quitting drugs. Alcohol use  Do not drink alcohol if: ? Your health care provider tells you not to drink. ? You are pregnant, may be pregnant, or are planning to become pregnant.  If you drink alcohol: ? Limit how much you use to 0-1 drink a day. ? Limit intake if you are breastfeeding.  Be aware of how much alcohol is in your drink. In the [place] one drink equals one 12 oz bottle of beer (355 mL), one 5 oz glass of wine (148 mL), or one 1 oz glass of hard liquor (44 mL). General instructions  Schedule regular health, dental, and eye exams.  Stay current with your  vaccines.  Tell your health care provider if: ? You often feel depressed. ? You have ever been abused or do not feel safe at home. Summary  Adopting a healthy lifestyle and getting preventive care are important in promoting health and wellness.  Follow your health care provider's instructions about healthy diet, exercising, and getting tested or screened for diseases.  Follow your health care provider's instructions on monitoring your cholesterol and blood pressure. This information is not intended to replace advice given to you by your health care provider. Make sure you discuss any questions you have with your health care provider. Document Released: 11/17/2010 Document Revised: 04/27/2018 Document Reviewed: 04/27/2018 Elsevier Patient Education  2020 [employer].

## 2018-12-06 NOTE — Progress Notes (Signed)
Patient: Pamela Nichols, Female    DOB: [birth year], 53 y.o.   MRN: [id number] Visit Date: 12/06/2018  Today's Provider: Delsa Grana, PA-C   Chief Complaint  Patient presents with  . Annual Exam   Subjective:    Annual physical exam Pamela Nichols is a 53 y.o. female who presents today for health maintenance and complete physical. She feels well. She reports exercising not much right now. She reports she is sleeping well.  -----------------------------------------------------------------  Weight has gone up since last visit 10 lbs with COVID   Wt Readings from Last 5 Encounters:  12/06/18 183 lb (83 kg)  03/07/18 164 lb 8 oz (74.6 kg)  02/07/18 164 lb (74.4 kg)  01/26/18 167 lb 15.9 oz (76.2 kg)  01/20/18 168 lb (76.2 kg)   She continues to struggle with chronic constipation and can no longer afford Linzess, be 145 mcg dose was little bit helpful but she cannot afford it anymore she would like to try higher dose if we have samples.  She is not trying any other over-the-counter stool softeners, fiber or any other dietary changes.  She has history of vitamin D deficiency is not using any over-the-counter supplements  She had total abdominal hysterectomy and bilateral salpingo-oophorectomy, is on replacement hormones managed by OB/GYN.  Up-to-date on colonoscopy  Due for mammogram    Review of Systems  Constitutional: Negative.  Negative for activity change, appetite change, fatigue and unexpected weight change.  HENT: Negative.   Eyes: Negative.   Respiratory: Negative.  Negative for shortness of breath.   Cardiovascular: Negative.  Negative for chest pain, palpitations and leg swelling.  Gastrointestinal: Negative.  Negative for abdominal pain and blood in stool.  Endocrine: Negative.   Genitourinary: Negative.   Musculoskeletal: Negative.  Negative for arthralgias, gait problem, joint swelling and myalgias.  Skin: Negative.  Negative for color change,  pallor and rash.  Allergic/Immunologic: Negative.   Neurological: Negative.  Negative for syncope and weakness.  Hematological: Negative.   Psychiatric/Behavioral: Negative.  Negative for confusion, dysphoric mood, self-injury and suicidal ideas. The patient is not nervous/anxious.     Social History      She  reports that she has never smoked. She has never used smokeless tobacco. She reports that she does not drink alcohol or use drugs.       Social History   Socioeconomic History  . Marital status: Married    Spouse name: Not on file  . Number of children: 2  . Years of education: 37  . Highest education level: Not on file  Occupational History  . Occupation: Hair Stylist  Social Needs  . Financial resource strain: Patient refused  . Food insecurity    Worry: Patient refused    Inability: Patient refused  . Transportation needs    Medical: Patient refused    Non-medical: Patient refused  Tobacco Use  . Smoking status: Never Smoker  . Smokeless tobacco: Never Used  Substance and Sexual Activity  . Alcohol use: No    Alcohol/week: 0.0 standard drinks  . Drug use: No  . Sexual activity: Not Currently    Birth control/protection: Surgical, Post-menopausal  Lifestyle  . Physical activity    Days per week: Patient refused    Minutes per session: Patient refused  . Stress: Patient refused  Relationships  . Social connections    Talks on phone: Patient refused    Gets together: Patient refused    Attends religious service: Patient refused  Active member of club or organization: Patient refused    Attends meetings of clubs or organizations: Patient refused    Relationship status: Patient refused  Other Topics Concern  . Not on file  Social History Narrative   Elim grew up in [place]. She is married and lives in [place] with her husband. She works as a Probation officer. She and her husband are raising their nephew that is 67 years old. She has 2 adult  children from a previous marriage. Her children live in [place]. She enjoys being outdoors. She also enjoys relaxing and watching TV.    Past Medical History:  Diagnosis Date  . Arthritis    R shoulder  . Chicken pox   . GERD (gastroesophageal reflux disease)   . Vitamin D deficiency      Patient Active Problem List   Diagnosis Date Noted  . S/P hysterectomy 01/26/2018  . Pericardial effusion 11/23/2017  . Uterine leiomyoma 11/23/2017  . Obese 06/25/2015  . Low back pain 06/25/2015  . Encounter for routine gynecological examination 11/04/2014  . Right shoulder pain 04/09/2014  . Abnormal laboratory test result 10/23/2013  . Skin lesion 10/23/2013  . Abdominal pain, epigastric 10/23/2013  . Constipation 07/10/2013  . Screening for colon cancer 07/10/2013  . Screening for breast cancer 07/10/2013  . Vitamin D deficiency 07/10/2013  . Routine general medical examination at a health care facility 07/10/2013    Past Surgical History:  Procedure Laterality Date  . SALPINGOOPHORECTOMY Bilateral 01/26/2018   Procedure: BILATERAL SALPINGO OOPHORECTOMY;  Surgeon: Florian Buff, MD;  Location: [facility];  Service: Gynecology;  Laterality: Bilateral;  . SUPRACERVICAL ABDOMINAL HYSTERECTOMY N/A 01/26/2018   Procedure: HYSTERECTOMY SUPRACERVICAL ABDOMINAL;  Surgeon: Florian Buff, MD;  Location: [facility];  Service: Gynecology;  Laterality: N/A;  . TUBAL LIGATION      Family History        Family Status  Relation Name Status  . Mother  Alive  . Father  Deceased  . Sister  Alive  . Brother  Alive  . Daughter  Alive  . Son  Alive  . Sister  Alive  . Sister  Alive  . Son  Deceased  . Neg Hx  (Not Specified)        Her family history includes Cancer in her father; Hypertension in her mother; Mental illness in her mother. There is no history of Breast cancer.      No Known Allergies   Current Outpatient Medications:  .  estradiol (ESTRACE) 2 MG tablet, TAKE 1 TABLET BY MOUTH  EVERY DAY, Disp: 90 tablet, Rfl: 4   Patient Care Team: Delsa Grana, PA-C as PCP - General (Family Medicine)      Objective:   Vitals: BP 130/88   Pulse 70   Temp 97.9 F (36.6 C) (Oral)   Resp 18   Ht 5\' 2"  (1.575 m)   Wt 183 lb (83 kg)   LMP 04/02/2014   SpO2 98%   BMI 33.47 kg/m    Vitals:   12/06/18 0937  BP: 130/88  Pulse: 70  Resp: 18  Temp: 97.9 F (36.6 C)  TempSrc: Oral  SpO2: 98%  Weight: 183 lb (83 kg)  Height: 5\' 2"  (1.575 m)     Physical Exam Vitals signs and nursing note reviewed.  Constitutional:      General: She is not in acute distress.    Appearance: Normal appearance. She is well-developed. She is not ill-appearing, toxic-appearing or  diaphoretic.  HENT:     Head: Normocephalic and atraumatic.     Right Ear: External ear normal.     Left Ear: External ear normal.     Nose: Nose normal.     Mouth/Throat:     Mouth: Mucous membranes are moist.     Pharynx: Oropharynx is clear. Uvula midline.  Eyes:     General: Lids are normal.        Right eye: No discharge.        Left eye: No discharge.     Conjunctiva/sclera: Conjunctivae normal.     Pupils: Pupils are equal, round, and reactive to light.  Neck:     Musculoskeletal: Normal range of motion and neck supple.     Thyroid: No thyromegaly.     Trachea: Phonation normal. No tracheal deviation.  Cardiovascular:     Rate and Rhythm: Normal rate and regular rhythm.     Pulses: Normal pulses.          Radial pulses are 2+ on the right side and 2+ on the left side.       Posterior tibial pulses are 2+ on the right side and 2+ on the left side.     Heart sounds: Normal heart sounds. No murmur. No friction rub. No gallop.   Pulmonary:     Effort: Pulmonary effort is normal. No respiratory distress.     Breath sounds: Normal breath sounds. No stridor. No wheezing, rhonchi or rales.  Chest:     Chest wall: No tenderness.  Abdominal:     General: Bowel sounds are normal. There is no  distension.     Palpations: Abdomen is soft.     Tenderness: There is no abdominal tenderness. There is no right CVA tenderness, left CVA tenderness, guarding or rebound.  Musculoskeletal: Normal range of motion.        General: No deformity.  Lymphadenopathy:     Cervical: No cervical adenopathy.  Skin:    General: Skin is warm and dry.     Capillary Refill: Capillary refill takes less than 2 seconds.     Coloration: Skin is not pale.     Findings: No rash.  Neurological:     Mental Status: She is alert.     Motor: No abnormal muscle tone.     Gait: Gait normal.  Psychiatric:        Mood and Affect: Mood normal.        Speech: Speech normal.        Behavior: Behavior normal.      Depression Screen PHQ 2/9 Scores 12/06/2018 12/06/2017 10/26/2016  PHQ - 2 Score 0 0 0     Office Visit from 12/06/2018 in [facility]  AUDIT-C Score  0         Assessment & Plan:     Routine Health Maintenance and Physical Exam  Exercise Activities and Dietary recommendations  Goals - increase exercise and improve diet Discussed health benefits of physical activity, and encouraged her to engage in regular exercise appropriate for her age and condition.    There is no immunization history on file for this patient.  Health Maintenance  Topic Date Due  . Hepatitis C Screening  13-Mar-1966  . TETANUS/TDAP  08/19/1982  . MAMMOGRAM  05/28/2017  . INFLUENZA VACCINE  12/17/2018  . PAP SMEAR-Modifier  11/25/2020  . COLONOSCOPY  09/09/2023  . HIV Screening  Completed   CPE done, screening PHQ and Audit  neg HM updated as allowed by pt, states she will come back for Tdap booster another day      ICD-10-CM   1. Routine general medical examination at a health care facility  X78.47 COMPLETE METABOLIC PANEL WITH GFR    Lipid Panel    CBC with Differential    TSH    VITAMIN D 25 Hydroxy (Vit-D Deficiency, Fractures)  2. Vitamin D deficiency  E55.9 VITAMIN D 25 Hydroxy (Vit-D  Deficiency, Fractures)   history of Vit D deficiency, not supplementing  3. Breast cancer screening  Z12.39 MM 3D SCREEN BREAST BILATERAL    CANCELED: MM Digital Screening  4. Encounter for hepatitis C screening test for low risk patient  Z11.59 Hepatitis C antibody  5. Chronic constipation  K59.09 linaclotide (LINZESS) 145 MCG CAPS capsule   counseled on fiber and hydration, tx with miralax one capfull daily until soft stools, titrate to effect, samples given  6. Anemia, unspecified type  D64.9 CBC with Differential   hx of with past labs, recheck cbc, no findings of anemia on PE today  7. Class 1 obesity with body mass index (BMI) of 33.0 to 33.9 in adult, unspecified obesity type, unspecified whether serious comorbidity present  E66.9 Lipid Panel   Z68.33 TSH   weight increased with COVID quarantine - educated on diet and exercise, recommendations in handout  8. Need for Tdap vaccination  Z23    pt will come another day to get so arm not sore while working     Delsa Grana, PA-C 12/06/18 10:13 AM  [facility]

## 2018-12-07 ENCOUNTER — Other Ambulatory Visit: Payer: Self-pay | Admitting: Family Medicine

## 2018-12-07 ENCOUNTER — Encounter: Payer: Self-pay | Admitting: Family Medicine

## 2018-12-07 DIAGNOSIS — E559 Vitamin D deficiency, unspecified: Secondary | ICD-10-CM

## 2018-12-07 LAB — HEPATITIS C ANTIBODY
Hepatitis C Ab: NONREACTIVE
SIGNAL TO CUT-OFF: 0.01 (ref <1.00)

## 2018-12-07 LAB — CBC WITH DIFFERENTIAL/PLATELET
Absolute Monocytes: 343 cells/uL (ref 200–950)
Basophils Absolute: 22 cells/uL (ref 0–200)
Basophils Relative: 0.5 %
Eosinophils Absolute: 172 cells/uL (ref 15–500)
Eosinophils Relative: 3.9 %
HCT: 38.5 % (ref 35.0–45.0)
Hemoglobin: 12.3 g/dL (ref 11.7–15.5)
Lymphs Abs: 2081 cells/uL (ref 850–3900)
MCH: 28.3 pg (ref 27.0–33.0)
MCHC: 31.9 g/dL — ABNORMAL LOW (ref 32.0–36.0)
MCV: 88.5 fL (ref 80.0–100.0)
MPV: 10.4 fL (ref 7.5–12.5)
Monocytes Relative: 7.8 %
Neutro Abs: 1782 cells/uL (ref 1500–7800)
Neutrophils Relative %: 40.5 %
Platelets: 224 10*3/uL (ref 140–400)
RBC: 4.35 10*6/uL (ref 3.80–5.10)
RDW: 13.1 % (ref 11.0–15.0)
Total Lymphocyte: 47.3 %
WBC: 4.4 10*3/uL (ref 3.8–10.8)

## 2018-12-07 LAB — COMPLETE METABOLIC PANEL WITH GFR
AG Ratio: 1.5 (calc) (ref 1.0–2.5)
ALT: 11 U/L (ref 6–29)
AST: 13 U/L (ref 10–35)
Albumin: 4 g/dL (ref 3.6–5.1)
Alkaline phosphatase (APISO): 88 U/L (ref 37–153)
BUN: 12 mg/dL (ref 7–25)
CO2: 21 mmol/L (ref 20–32)
Calcium: 9.2 mg/dL (ref 8.6–10.4)
Chloride: 108 mmol/L (ref 98–110)
Creat: 0.8 mg/dL (ref 0.50–1.05)
GFR, Est African American: 96 mL/min/{1.73_m2} (ref >=60)
GFR, Est Non African American: 83 mL/min/{1.73_m2} (ref >=60)
Globulin: 2.7 g/dL (calc) (ref 1.9–3.7)
Glucose, Bld: 88 mg/dL (ref 65–99)
Potassium: 4.2 mmol/L (ref 3.5–5.3)
Sodium: 141 mmol/L (ref 135–146)
Total Bilirubin: 0.5 mg/dL (ref 0.2–1.2)
Total Protein: 6.7 g/dL (ref 6.1–8.1)

## 2018-12-07 LAB — LIPID PANEL
Cholesterol: 184 mg/dL (ref <200)
HDL: 76 mg/dL (ref >=50)
LDL Cholesterol (Calc): 94 mg/dL (calc)
Non-HDL Cholesterol (Calc): 108 mg/dL (calc) (ref <130)
Total CHOL/HDL Ratio: 2.4 (calc) (ref <5.0)
Triglycerides: 54 mg/dL (ref <150)

## 2018-12-07 LAB — TSH: TSH: 0.53 mIU/L

## 2018-12-07 LAB — VITAMIN D 25 HYDROXY (VIT D DEFICIENCY, FRACTURES): Vit D, 25-Hydroxy: 20 ng/mL — ABNORMAL LOW (ref 30–100)

## 2018-12-07 MED ORDER — VITAMIN D (ERGOCALCIFEROL) 1.25 MG (50000 UNIT) PO CAPS: 50000.0000 [IU] | ORAL_CAPSULE | ORAL | 0 refills | Status: DC

## 2019-01-18 ENCOUNTER — Ambulatory Visit: Payer: BLUE CROSS/BLUE SHIELD

## 2019-01-28 IMAGING — CT CT ABD-PELV W/ CM
2 of 5 series · 17 of 46 positions shown, 19 images · IV contrast (omnipaque)
Comparison: None

CLINICAL DATA: RIGHT flank and RIGHT lower quadrant pain since this
morning, no bowel movement for 3 days

EXAM:
CT ABDOMEN AND PELVIS WITH CONTRAST
TECHNIQUE: Multidetector CT imaging of the abdomen and pelvis was performed
using the standard protocol following bolus administration of
intravenous contrast. Sagittal and coronal MPR images reconstructed
from axial data set.
CONTRAST:  100mL OMNIPAQUE IOHEXOL 300 MG/ML SOLN IV. No oral
contrast.

[Series 3: abd/ pelvis 5.0 i30f 2 · axial · 0.87mm/px · z∈[+899,+1314]mm · 14 of 93 slices shown, 16 images]
[im 5/93  soft-tissue]
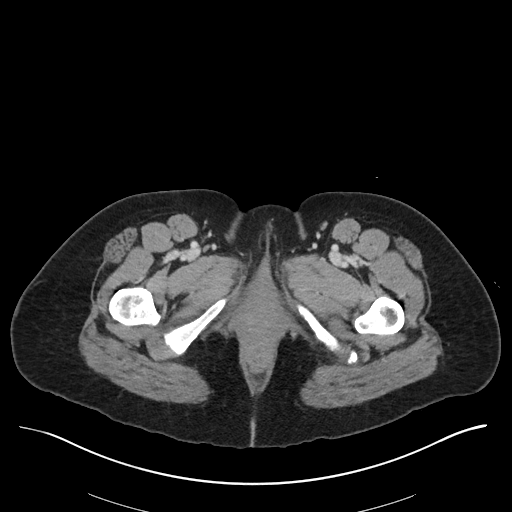
[im 5/93  bone]
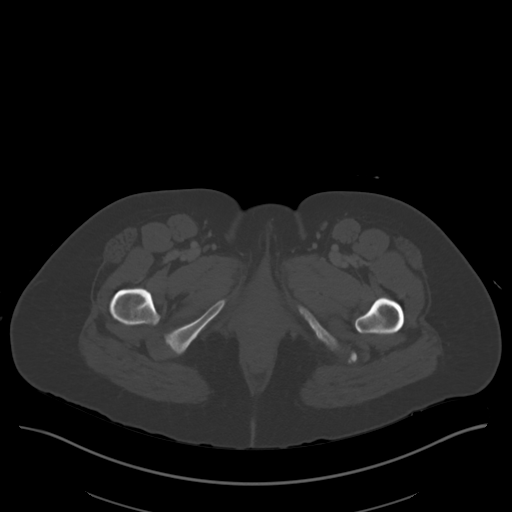
[im 14/93  soft-tissue]
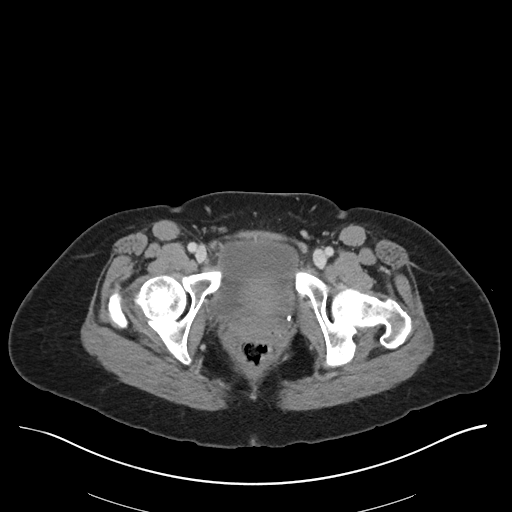
[im 18/93  soft-tissue]
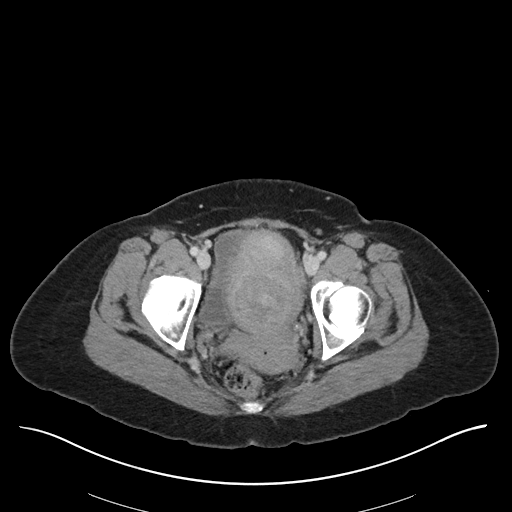
[im 27/93  soft-tissue]
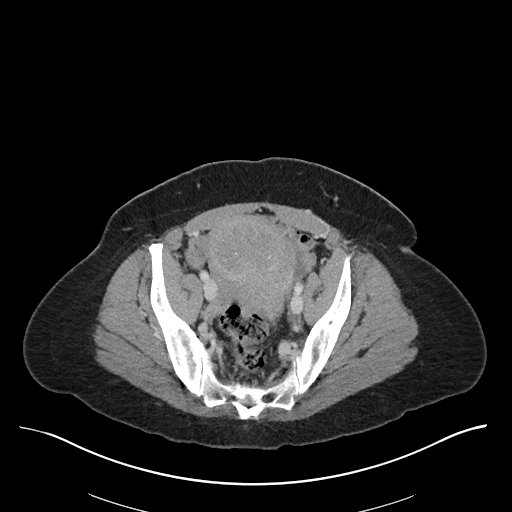
[im 31/93  soft-tissue]
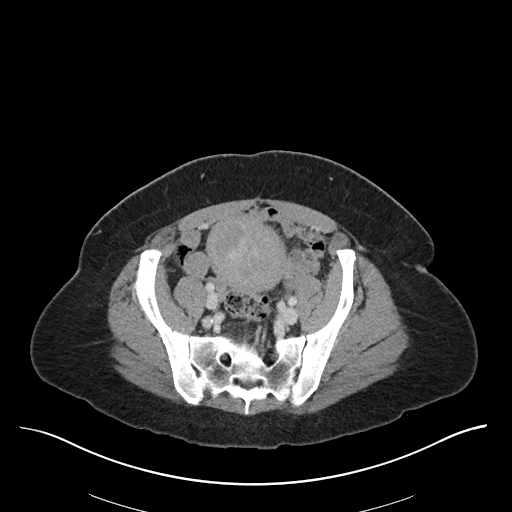
[im 36/93  soft-tissue]
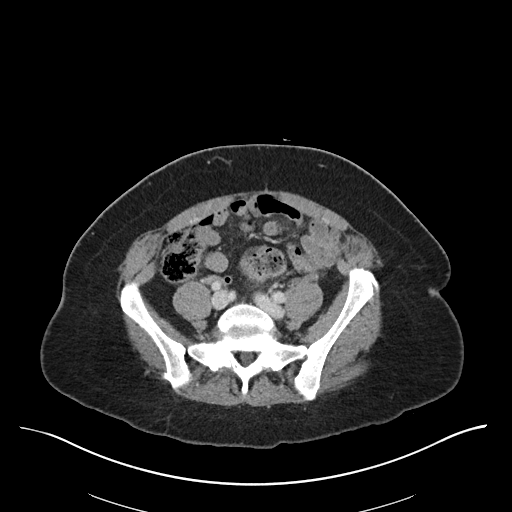
[im 44/93  soft-tissue]
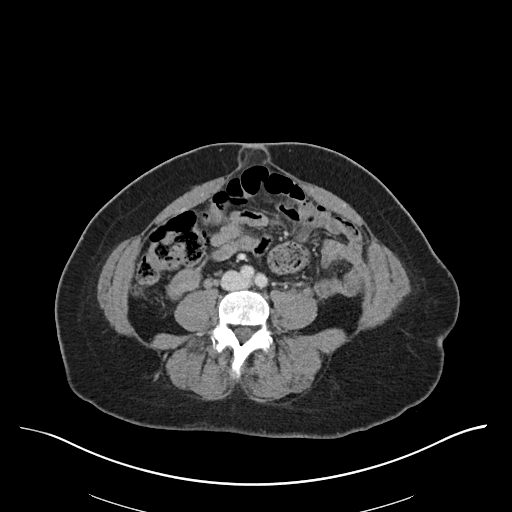
[im 49/93  soft-tissue]
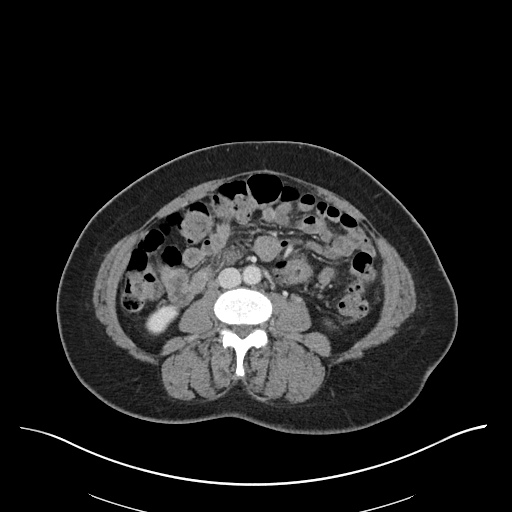
[im 57/93  soft-tissue]
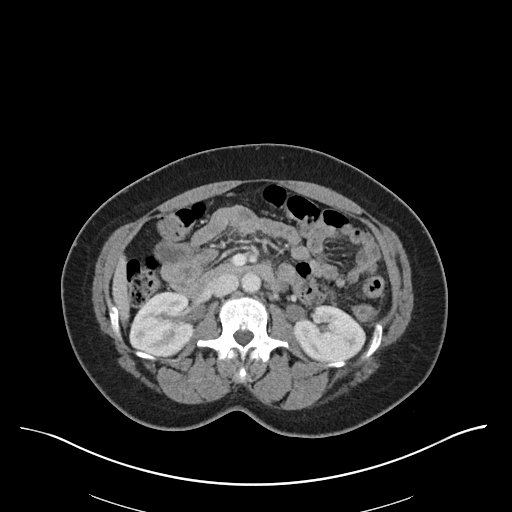
[im 57/93  bone]
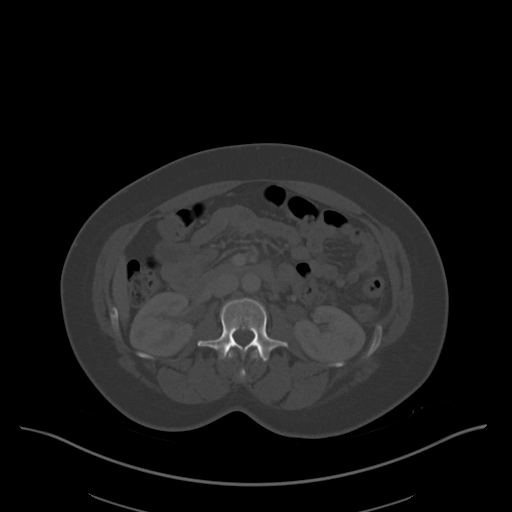
[im 62/93  soft-tissue]
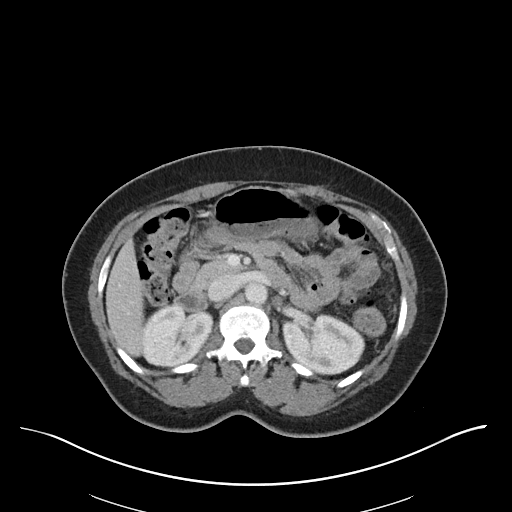
[im 71/93  soft-tissue]
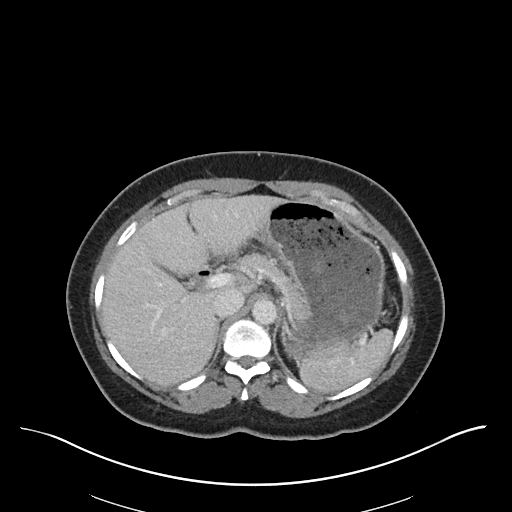
[im 75/93  soft-tissue]
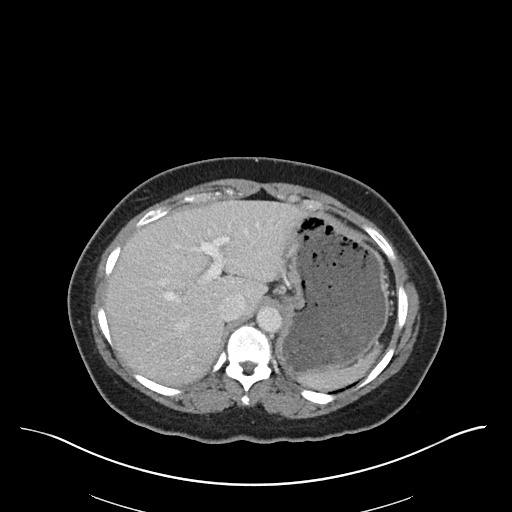
[im 79/93  soft-tissue]
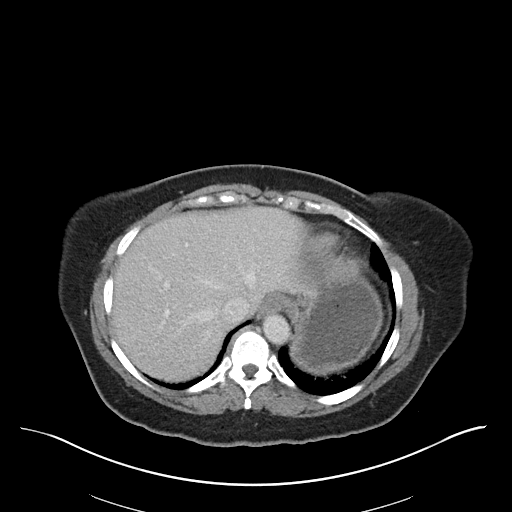
[im 88/93  soft-tissue]
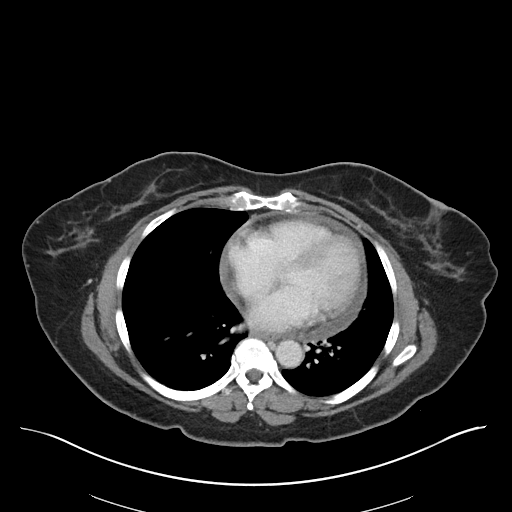

[Series 6: coronal soft tissue · coronal · 0.88mm/px · 3 of 100 slices shown]
[im 34/100  soft-tissue]
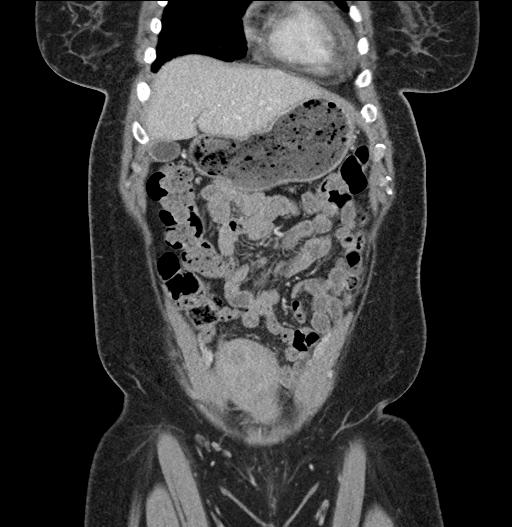
[im 45/100  soft-tissue]
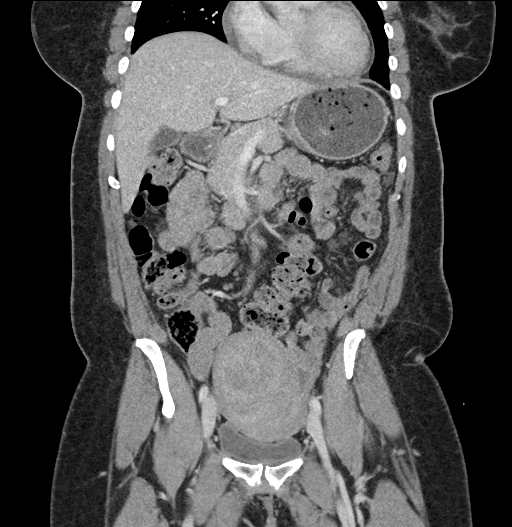
[im 56/100  soft-tissue]
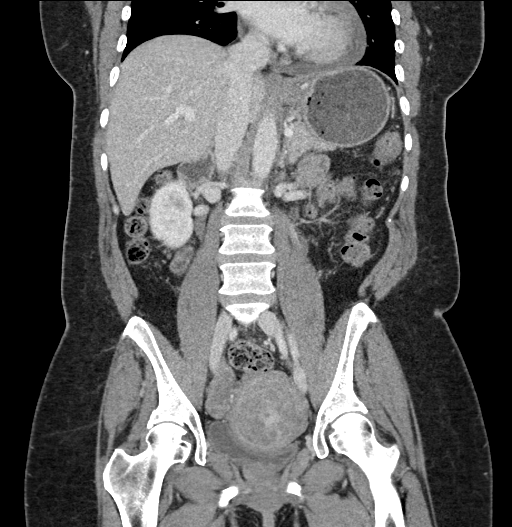

[17 of 46 positions shown; findings below may reference images not displayed]

FINDINGS: Lower chest: Minimal subsegmental atelectasis at lung bases. Small
pericardial effusion.

Hepatobiliary: Gallbladder and liver normal appearance

Pancreas: Normal appearance

Spleen: Normal appearance

Adrenals/Urinary Tract: Adrenal glands, kidneys, ureters, and
bladder normal appearance

Stomach/Bowel: Normal appendix. Stomach distended by fluid and food
debris. Normal retained stool burden. Large and small bowel loops
unremarkable.

Vascular/Lymphatic: Minimal atherosclerotic calcification aorta
without aneurysm. He no adenopathy

Reproductive: Enlarged uterus containing multiple nodules likely
multiple leiomyomata up to 6.6 x 5.8 x 6.6 cm. Unremarkable adnexa

Other: Small umbilical hernia containing fat. No free air or free
fluid. No definite inflammatory process.

Musculoskeletal: No acute osseous findings. Facet degenerative
changes lower lumbar spine greatest at L5-S1.
IMPRESSION: Enlarged uterus containing multiple nodules likely leiomyomata.

Umbilical hernia containing fat.

Small pericardial effusion of uncertain etiology; consider follow-up
echocardiography.

## 2019-02-26 ENCOUNTER — Other Ambulatory Visit: Payer: Self-pay | Admitting: Family Medicine

## 2019-02-26 DIAGNOSIS — E559 Vitamin D deficiency, unspecified: Secondary | ICD-10-CM

## 2019-09-26 ENCOUNTER — Other Ambulatory Visit: Payer: Self-pay | Admitting: Family Medicine

## 2019-09-26 DIAGNOSIS — Z1231 Encounter for screening mammogram for malignant neoplasm of breast: Secondary | ICD-10-CM

## 2019-10-02 ENCOUNTER — Ambulatory Visit
Admission: RE | Admit: 2019-10-02 | Discharge: 2019-10-02 | Disposition: A | Discharge status: Home / Self Care | Payer: BC Managed Care – PPO | Source: Ambulatory Visit | Attending: Family Medicine | Admitting: Family Medicine

## 2019-10-02 DIAGNOSIS — Z1231 Encounter for screening mammogram for malignant neoplasm of breast: Secondary | ICD-10-CM | POA: Diagnosis present

## 2019-10-03 ENCOUNTER — Encounter: Payer: Self-pay | Admitting: Family Medicine

## 2019-10-03 ENCOUNTER — Other Ambulatory Visit: Payer: Self-pay

## 2019-10-03 ENCOUNTER — Ambulatory Visit (INDEPENDENT_AMBULATORY_CARE_PROVIDER_SITE_OTHER): Payer: BC Managed Care – PPO | Admitting: Family Medicine

## 2019-10-03 VITALS — BP 132/84 | HR 94 | Temp 97.8°F | Resp 14 | Ht 62.0 in | Wt 181.9 lb

## 2019-10-03 DIAGNOSIS — R635 Abnormal weight gain: Secondary | ICD-10-CM | POA: Diagnosis not present

## 2019-10-03 DIAGNOSIS — E669 Obesity, unspecified: Secondary | ICD-10-CM | POA: Diagnosis not present

## 2019-10-03 DIAGNOSIS — Z6833 Body mass index (BMI) 33.0-33.9, adult: Secondary | ICD-10-CM

## 2019-10-03 DIAGNOSIS — K5900 Constipation, unspecified: Secondary | ICD-10-CM | POA: Diagnosis not present

## 2019-10-03 DIAGNOSIS — R030 Elevated blood-pressure reading, without diagnosis of hypertension: Secondary | ICD-10-CM | POA: Diagnosis not present

## 2019-10-03 NOTE — Progress Notes (Signed)
Patient ID: Pamela Nichols, female    DOB: 10/12/1965, 54 y.o.   MRN: [id number]  PCP: Delsa Grana, PA-C  Chief Complaint  Patient presents with  . Hypertension    Subjective:   Pamela Nichols is a 54 y.o. female, presents to clinic with CC of the following:  HPI  Pt is here for evaluation of slight intermittent Has that she notices seem to be associated with higher BP over the past couple months.  She called in for an appt here when her BP was 160/90.   Today BP is 140/92 and she reports being very stressed for the past 6-9 months, she was caring for her mother, who recently passed away, she is working a lot - usually 12 hours a day Not previously dx with or ever taken med for HTN  BP Readings from Last 10 Encounters:  10/03/19 132/84  12/06/18 130/88  06/04/18 134/82  03/07/18 112/79  02/07/18 131/88  01/27/18 124/73  01/20/18 (!) 156/91  12/06/17 138/86  11/25/17 98/70  11/23/17 118/82   Pt denies CP, SOB, exertional sx, LE edema, palpitation, visual disturbances,  No lightheadedness, hypotension, syncope. Dietary efforts for BP?  Working on diet and exercise When she had HA's- HA was all over and slightly more to forehead, "slight headache" also had neck pain and occipital pain last week    Gained weight since 2019  Wt Readings from Last 5 Encounters:  10/03/19 181 lb 14.4 oz (82.5 kg)  12/06/18 183 lb (83 kg)  03/07/18 164 lb 8 oz (74.6 kg)  02/07/18 164 lb (74.4 kg)  01/26/18 167 lb 15.9 oz (76.2 kg)   BMI Readings from Last 5 Encounters:  10/03/19 33.27 kg/m  12/06/18 33.47 kg/m  03/07/18 30.09 kg/m  02/07/18 30.00 kg/m  01/26/18 30.73 kg/m   She continues to struggle with chronic constipation and can no longer afford Linzess, be 145 mcg dose was little bit helpful but she cannot afford it anymore she would like to try higher dose if we have samples.  She is not trying any other over-the-counter stool softeners, fiber or any other dietary  changes. Still having constipation and cannot afford linzess   Patient Active Problem List   Diagnosis Date Noted  . S/P hysterectomy 01/26/2018  . Pericardial effusion 11/23/2017  . Uterine leiomyoma 11/23/2017  . Obese 06/25/2015  . Low back pain 06/25/2015  . Encounter for routine gynecological examination 11/04/2014  . Right shoulder pain 04/09/2014  . Abnormal laboratory test result 10/23/2013  . Skin lesion 10/23/2013  . Abdominal pain, epigastric 10/23/2013  . Constipation 07/10/2013  . Screening for colon cancer 07/10/2013  . Screening for breast cancer 07/10/2013  . Vitamin D deficiency 07/10/2013  . Routine general medical examination at a health care facility 07/10/2013      Current Outpatient Medications:  .  cholecalciferol (VITAMIN D3) 25 MCG (1000 UNIT) tablet, Take 1,000 Units by mouth daily., Disp: , Rfl:    No Known Allergies   Family History  Problem Relation Age of Onset  . Hypertension Mother   . Mental illness Mother        Depression  . Cancer Father        prostate   . Hypertension Sister   . Hypertension Sister   . Hypertension Sister   . Breast cancer Neg Hx      Social History   Socioeconomic History  . Marital status: Married    Spouse name: Not on file  .  Number of children: 2  . Years of education: 42  . Highest education level: Not on file  Occupational History  . Occupation: Hair Stylist  Tobacco Use  . Smoking status: Never Smoker  . Smokeless tobacco: Never Used  Substance and Sexual Activity  . Alcohol use: No    Alcohol/week: 0.0 standard drinks  . Drug use: No  . Sexual activity: Yes    Birth control/protection: Surgical, Post-menopausal  Other Topics Concern  . Not on file  Social History Narrative   Sanniyah grew up in [place]. She is married and lives in [place] with her husband. 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 She has 2 adult children  from a previous marriage. Her children live in [place]. She enjoys being outdoors. She also enjoys relaxing and watching TV.   Social Determinants of Health   Financial Resource Strain:   . Difficulty of Paying Living Expenses:   Food Insecurity:   . Worried About Charity fundraiser in the Last Year:   . Arboriculturist in the Last Year:   Transportation Needs:   . Film/video editor (Medical):   Marland Kitchen Lack of Transportation (Non-Medical):   Physical Activity:   . Days of Exercise per Week:   . Minutes of Exercise per Session:   Stress:   . Feeling of Stress :   Social Connections:   . Frequency of Communication with Friends and Family:   . Frequency of Social Gatherings with Friends and Family:   . Attends Religious Services:   . Active Member of Clubs or Organizations:   . Attends Archivist Meetings:   Marland Kitchen Marital Status:   Intimate Partner Violence:   . Fear of Current or Ex-Partner:   . Emotionally Abused:   Marland Kitchen Physically Abused:   . Sexually Abused:     Chart Review Today: I personally reviewed active problem list, medication list, allergies, family history, social history, health maintenance, notes from last encounter, lab results, imaging with the patient/caregiver today.   Review of Systems 10 Systems reviewed and are negative for acute change except as noted in the HPI.     Objective:   Vitals:   10/03/19 1444  BP: (!) 140/92  Pulse: 94  Resp: 14  Temp: 97.8 F (36.6 C)  SpO2: 100%  Weight: 181 lb 14.4 oz (82.5 kg)  Height: 5\' 2"  (1.575 m)    Body mass index is 33.27 kg/m.  Physical Exam Vitals and nursing note reviewed.  Constitutional:      General: She is not in acute distress.    Appearance: Normal appearance. She is well-developed. She is obese. She is not ill-appearing or toxic-appearing.  HENT:     Head: Normocephalic and atraumatic.  Eyes:     General:        Right eye: No discharge.        Left eye: No discharge.      Conjunctiva/sclera: Conjunctivae normal.  Neck:     Trachea: No tracheal deviation.  Cardiovascular:     Rate and Rhythm: Normal rate and regular rhythm.     Pulses: Normal pulses.     Heart sounds: Normal heart sounds. No murmur. No friction rub. No gallop.   Pulmonary:     Effort: Pulmonary effort is normal. No respiratory distress.     Breath sounds: Normal breath sounds. No stridor. No wheezing, rhonchi or rales.  Abdominal:     General: Bowel sounds are normal. There is no distension.     Palpations: Abdomen is soft.  Musculoskeletal:     Right lower leg: No edema.     Left lower leg: No edema.  Skin:    General: Skin is warm and dry.     Findings: No rash.  Neurological:     Mental Status: She is alert.     Motor: No abnormal muscle tone.     Coordination: Coordination normal.  Psychiatric:        Mood and Affect: Mood normal.        Behavior: Behavior normal.      Results for orders placed or performed in visit on 12/06/18  COMPLETE METABOLIC PANEL WITH GFR  Result Value Ref Range   Glucose, Bld 88 65 - 99 mg/dL   BUN 12 7 - 25 mg/dL   Creat 0.80 0.50 - 1.05 mg/dL   GFR, Est Non African American 83 > OR = 60 mL/min/1.44m2   GFR, Est African American 96 > OR = 60 mL/min/1.41m2   BUN/Creatinine Ratio NOT APPLICABLE 6 - 22 (calc)   Sodium 141 135 - 146 mmol/L   Potassium 4.2 3.5 - 5.3 mmol/L   Chloride 108 98 - 110 mmol/L   CO2 21 20 - 32 mmol/L   Calcium 9.2 8.6 - 10.4 mg/dL   Total Protein 6.7 6.1 - 8.1 g/dL   Albumin 4.0 3.6 - 5.1 g/dL   Globulin 2.7 1.9 - 3.7 g/dL (calc)   AG Ratio 1.5 1.0 - 2.5 (calc)   Total Bilirubin 0.5 0.2 - 1.2 mg/dL   Alkaline phosphatase (APISO) 88 37 - 153 U/L   AST 13 10 - 35 U/L   ALT 11 6 - 29 U/L  Lipid Panel  Result Value Ref Range   Cholesterol 184 <200 mg/dL   HDL 76 > OR = 50 mg/dL   Triglycerides 54 <150 mg/dL   LDL Cholesterol (Calc) 94 mg/dL (calc)   Total CHOL/HDL Ratio 2.4 <5.0 (calc)   Non-HDL Cholesterol (Calc)  108 <130 mg/dL (calc)  CBC with Differential  Result Value Ref Range   WBC 4.4 3.8 - 10.8 Thousand/uL   RBC 4.35 3.80 - 5.10 Million/uL   Hemoglobin 12.3 11.7 - 15.5 g/dL   HCT 38.5 35.0 - 45.0 %   MCV 88.5 80.0 - 100.0 fL   MCH 28.3 27.0 - 33.0 pg   MCHC 31.9 (L) 32.0 - 36.0 g/dL   RDW 13.1 11.0 - 15.0 %   Platelets 224 140 - 400 Thousand/uL   MPV 10.4 7.5 - 12.5 fL   Neutro Abs 1,782 1,500 - 7,800 cells/uL   Lymphs Abs 2,081 850 - 3,900 cells/uL   Absolute Monocytes 343 200 - 950 cells/uL   Eosinophils Absolute 172 15 - 500 cells/uL   Basophils Absolute 22 0 - 200 cells/uL   Neutrophils Relative % 40.5 %   Total Lymphocyte 47.3 %   Monocytes Relative 7.8 %   Eosinophils Relative 3.9 %   Basophils Relative 0.5 %  TSH  Result Value Ref Range   TSH 0.53 mIU/L  VITAMIN D 25 Hydroxy (Vit-D Deficiency, Fractures)  Result Value Ref Range   Vit D, 25-Hydroxy 20 (L) 30 - 100 ng/mL  Hepatitis C antibody  Result Value Ref Range   Hepatitis C Ab NON-REACTIVE NON-REACTI   SIGNAL TO CUT-OFF 0.01 <1.00        Assessment & Plan:  1. Elevated BP without diagnosis of hypertension BP decreased while in clinic, has been variable over the past couple years - will have pt monitor BP, continue to work on diet and exercise, DASH diet reviewed and printed - pt will f/up in the next 1-2 months Offered to start low dose meds but pt preferred to continue to work on lifestyle and f/up closely - [place] GFR  2. Constipation, unspecified constipation type Samples of linzess given 145 mg - other OTC reviewed Will check thyroid, suggested she f/up with GI specialist - will see if she can return to who did her colonoscopy - TSH  3. Class 1 obesity without serious comorbidity with body mass index (BMI) of 33.0 to 33.9 in adult, unspecified obesity type Weight jumped up from 2019 - 2020, r/o hypothyroid, return for further discussion of medical weight management - COMPLETE  METABOLIC PANEL WITH GFR - TSH  4. Weight gain See above - TSH    Delsa Grana, PA-C 10/03/19 3:02 PM

## 2019-10-03 NOTE — Patient Instructions (Addendum)
Blood Pressure Record Sheet  Blood pressure log Date: _______________________  a.m. _____________________(1st reading) _____________________(2nd reading)  p.m. _____________________(1st reading) _____________________(2nd reading) Date: _______________________  a.m. _____________________(1st reading) _____________________(2nd reading)  p.m. _____________________(1st reading) _____________________(2nd reading) Date: _______________________  a.m. _____________________(1st reading) _____________________(2nd reading)  p.m. _____________________(1st reading) _____________________(2nd reading) Date: _______________________  a.m. _____________________(1st reading) _____________________(2nd reading)  p.m. _____________________(1st reading) _____________________(2nd reading) Date: _______________________  a.m. _____________________(1st reading) _____________________(2nd reading)  p.m. _____________________(1st reading) _____________________(2nd reading) Date: _______________________  a.m. _____________________(1st reading) _____________________(2nd reading)  p.m. _____________________(1st reading) _____________________(2nd reading) Date: _______________________  a.m. _____________________(1st reading) _____________________(2nd reading)  p.m. _____________________(1st reading) _____________________(2nd reading) Date: _______________________  a.m. _____________________(1st reading) _____________________(2nd reading)  p.m. _____________________(1st reading) _____________________(2nd reading) Date: _______________________  a.m. _____________________(1st reading) _____________________(2nd reading)  p.m. _____________________(1st reading) _____________________(2nd reading) Date: _______________________  a.m. _____________________(1st reading) _____________________(2nd reading)  p.m. _____________________(1st reading) _____________________(2nd reading)     DASH Eating  Plan DASH stands for "Dietary Approaches to Stop Hypertension." The DASH eating plan is a healthy eating plan that has been shown to reduce high blood pressure (hypertension). It may also reduce your risk for type 2 diabetes, heart disease, and stroke. The DASH eating plan may also help with weight loss. What are tips for following this plan?  General guidelines  Avoid eating more than 2,300 mg (milligrams) of salt (sodium) a day. If you have hypertension, you may need to reduce your sodium intake to 1,500 mg a day.  Limit alcohol intake to no more than 1 drink a day for nonpregnant women and 2 drinks a day for men. One drink equals 12 oz of beer, 5 oz of wine, or 1 oz of hard liquor.  Work with your health care provider to maintain a healthy body weight or to lose weight. Ask what an ideal weight is for you.  Get at least 30 minutes of exercise that causes your heart to beat faster (aerobic exercise) most days of the week. Activities may include walking, swimming, or biking.  Work with your health care provider or diet and nutrition specialist (dietitian) to adjust your eating plan to your individual calorie needs. Reading food labels   Check food labels for the amount of sodium per serving. Choose foods with less than 5 percent of the Daily Value of sodium. Generally, foods with less than 300 mg of sodium per serving fit into this eating plan.  To find whole grains, look for the word "whole" as the first word in the ingredient list. Shopping  Buy products labeled as "low-sodium" or "no salt added."  Buy fresh foods. Avoid canned foods and premade or frozen meals. Cooking  Avoid adding salt when cooking. Use salt-free seasonings or herbs instead of table salt or sea salt. Check with your health care provider or pharmacist before using salt substitutes.  Do not fry foods. Cook foods using healthy methods such as baking, boiling, grilling, and broiling instead.  Cook with  heart-healthy oils, such as olive, canola, soybean, or sunflower oil. Meal planning  Eat a balanced diet that includes: ? 5 or more servings of fruits and vegetables each day. At each meal, try to fill half of your plate with fruits and vegetables. ? Up to 6-8 servings of whole grains each day. ? Less than 6 oz of lean meat, poultry, or fish each day. A 3-oz serving of meat is about the same size as a deck of cards. One egg equals 1 oz. ? 2 servings of low-fat dairy each day. ? A serving of  nuts, seeds, or beans 5 times each week. ? Heart-healthy fats. Healthy fats called Omega-3 fatty acids are found in foods such as flaxseeds and coldwater fish, like sardines, salmon, and mackerel.  Limit how much you eat of the following: ? Canned or prepackaged foods. ? Food that is high in trans fat, such as fried foods. ? Food that is high in saturated fat, such as fatty meat. ? Sweets, desserts, sugary drinks, and other foods with added sugar. ? Full-fat dairy products.  Do not salt foods before eating.  Try to eat at least 2 vegetarian meals each week.  Eat more home-cooked food and less restaurant, buffet, and fast food.  When eating at a restaurant, ask that your food be prepared with less salt or no salt, if possible. What foods are recommended? The items listed may not be a complete list. Talk with your dietitian about what dietary choices are best for you. Grains Whole-grain or whole-wheat bread. Whole-grain or whole-wheat pasta. Brown rice. [place]. Bulgur. Whole-grain and low-sodium cereals. Pita bread. Low-fat, low-sodium crackers. Whole-wheat flour tortillas. Vegetables Fresh or frozen vegetables (raw, steamed, roasted, or grilled). Low-sodium or reduced-sodium tomato and vegetable juice. Low-sodium or reduced-sodium tomato sauce and tomato paste. Low-sodium or reduced-sodium canned vegetables. Fruits All fresh, dried, or frozen fruit. Canned fruit in natural juice (without  added sugar). Meat and other protein foods Skinless chicken or [place]. Ground chicken or [place]. Pork with fat trimmed off. Fish and seafood. Egg whites. Dried beans, peas, or lentils. Unsalted nuts, nut butters, and seeds. Unsalted canned beans. Lean cuts of beef with fat trimmed off. Low-sodium, lean deli meat. Dairy Low-fat (1%) or fat-free (skim) milk. Fat-free, low-fat, or reduced-fat cheeses. Nonfat, low-sodium ricotta or cottage cheese. Low-fat or nonfat yogurt. Low-fat, low-sodium cheese. Fats and oils Soft margarine without trans fats. Vegetable oil. Low-fat, reduced-fat, or light mayonnaise and salad dressings (reduced-sodium). Canola, safflower, olive, soybean, and sunflower oils. Avocado. Seasoning and other foods Herbs. Spices. Seasoning mixes without salt. Unsalted popcorn and pretzels. Fat-free sweets. What foods are not recommended? The items listed may not be a complete list. Talk with your dietitian about what dietary choices are best for you. Grains Baked goods made with fat, such as croissants, muffins, or some breads. Dry pasta or rice meal packs. Vegetables Creamed or fried vegetables. Vegetables in a cheese sauce. Regular canned vegetables (not low-sodium or reduced-sodium). Regular canned tomato sauce and paste (not low-sodium or reduced-sodium). Regular tomato and vegetable juice (not low-sodium or reduced-sodium). Angie Fava. Olives. Fruits Canned fruit in a light or heavy syrup. Fried fruit. Fruit in cream or butter sauce. Meat and other protein foods Fatty cuts of meat. Ribs. Fried meat. Berniece Salines. Sausage. Bologna and other processed lunch meats. Salami. Fatback. Hotdogs. Bratwurst. Salted nuts and seeds. Canned beans with added salt. Canned or smoked fish. Whole eggs or egg yolks. Chicken or [place] with skin. Dairy Whole or 2% milk, cream, and half-and-half. Whole or full-fat cream cheese. Whole-fat or sweetened yogurt. Full-fat cheese. Nondairy creamers. Whipped toppings.  Processed cheese and cheese spreads. Fats and oils Butter. Stick margarine. Lard. Shortening. Ghee. Bacon fat. Tropical oils, such as coconut, palm kernel, or palm oil. Seasoning and other foods Salted popcorn and pretzels. Onion salt, garlic salt, seasoned salt, table salt, and sea salt. Worcestershire sauce. Tartar sauce. Barbecue sauce. Teriyaki sauce. Soy sauce, including reduced-sodium. Steak sauce. Canned and packaged gravies. Fish sauce. Oyster sauce. Cocktail sauce. Horseradish that you find on the shelf. Ketchup. Mustard. Meat flavorings  and tenderizers. Bouillon cubes. Hot sauce and Tabasco sauce. Premade or packaged marinades. Premade or packaged taco seasonings. Relishes. Regular salad dressings. Where to find more information:  National Heart, Lung, and [place]: https://wilson-eaton.com/  American Heart Association: www.heart.org Summary  The DASH eating plan is a healthy eating plan that has been shown to reduce high blood pressure (hypertension). It may also reduce your risk for type 2 diabetes, heart disease, and stroke.  With the DASH eating plan, you should limit salt (sodium) intake to 2,300 mg a day. If you have hypertension, you may need to reduce your sodium intake to 1,500 mg a day.  When on the DASH eating plan, aim to eat more fresh fruits and vegetables, whole grains, lean proteins, low-fat dairy, and heart-healthy fats.  Work with your health care provider or diet and nutrition specialist (dietitian) to adjust your eating plan to your individual calorie needs. This information is not intended to replace advice given to you by your health care provider. Make sure you discuss any questions you have with your health care provider. Document Revised: 04/16/2017 Document Reviewed: 04/27/2016 Elsevier Patient Education  2020 [employer].   Managing Your Hypertension Hypertension is commonly called high blood pressure. This is when the force of your blood pressing  against the walls of your arteries is too strong. Arteries are blood vessels that carry blood from your heart throughout your body. Hypertension forces the heart to work harder to pump blood, and may cause the arteries to become narrow or stiff. Having untreated or uncontrolled hypertension can cause heart attack, stroke, kidney disease, and other problems. What are blood pressure readings? A blood pressure reading consists of a higher number over a lower number. Ideally, your blood pressure should be below 120/80. The first ("top") number is called the systolic pressure. It is a measure of the pressure in your arteries as your heart beats. The second ("bottom") number is called the diastolic pressure. It is a measure of the pressure in your arteries as the heart relaxes. What does my blood pressure reading mean? Blood pressure is classified into four stages. Based on your blood pressure reading, your health care provider may use the following stages to determine what type of treatment you need, if any. Systolic pressure and diastolic pressure are measured in a unit called mm Hg. Normal  Systolic pressure: below 123456.  Diastolic pressure: below 80. Elevated  Systolic pressure: [id number].  Diastolic pressure: below 80. Hypertension stage 1  Systolic pressure: 0000000.  Diastolic pressure: [id number]. Hypertension stage 2  Systolic pressure: [id number] or above.  Diastolic pressure: 90 or above. What health risks are associated with hypertension? Managing your hypertension is an important responsibility. Uncontrolled hypertension can lead to:  A heart attack.  A stroke.  A weakened blood vessel (aneurysm).  Heart failure.  Kidney damage.  Eye damage.  Metabolic syndrome.  Memory and concentration problems. What changes can I make to manage my hypertension? Hypertension can be managed by making lifestyle changes and possibly by taking medicines. Your health care provider will help you make  a plan to bring your blood pressure within a normal range. Eating and drinking   Eat a diet that is high in fiber and potassium, and low in salt (sodium), added sugar, and fat. An example eating plan is called the DASH (Dietary Approaches to Stop Hypertension) diet. To eat this way: ? Eat plenty of fresh fruits and vegetables. Try to fill half of your plate at each meal with  fruits and vegetables. ? Eat whole grains, such as whole wheat pasta, brown rice, or whole grain bread. Fill about one quarter of your plate with whole grains. ? Eat low-fat diary products. ? Avoid fatty cuts of meat, processed or cured meats, and poultry with skin. Fill about one quarter of your plate with lean proteins such as fish, chicken without skin, beans, eggs, and tofu. ? Avoid premade and processed foods. These tend to be higher in sodium, added sugar, and fat.  Reduce your daily sodium intake. Most people with hypertension should eat less than 1,500 mg of sodium a day.  Limit alcohol intake to no more than 1 drink a day for nonpregnant women and 2 drinks a day for men. One drink equals 12 oz of beer, 5 oz of wine, or 1 oz of hard liquor. Lifestyle  Work with your health care provider to maintain a healthy body weight, or to lose weight. Ask what an ideal weight is for you.  Get at least 30 minutes of exercise that causes your heart to beat faster (aerobic exercise) most days of the week. Activities may include walking, swimming, or biking.  Include exercise to strengthen your muscles (resistance exercise), such as weight lifting, as part of your weekly exercise routine. Try to do these types of exercises for 30 minutes at least 3 days a week.  Do not use any products that contain nicotine or tobacco, such as cigarettes and e-cigarettes. If you need help quitting, ask your health care provider.  Control any long-term (chronic) conditions you have, such as high cholesterol or diabetes. Monitoring  Monitor  your blood pressure at home as told by your health care provider. Your personal target blood pressure may vary depending on your medical conditions, your age, and other factors.  Have your blood pressure checked regularly, as often as told by your health care provider. Working with your health care provider  Review all the medicines you take with your health care provider because there may be side effects or interactions.  Talk with your health care provider about your diet, exercise habits, and other lifestyle factors that may be contributing to hypertension.  Visit your health care provider regularly. Your health care provider can help you create and adjust your plan for managing hypertension. Will I need medicine to control my blood pressure? Your health care provider may prescribe medicine if lifestyle changes are not enough to get your blood pressure under control, and if:  Your systolic blood pressure is 130 or higher.  Your diastolic blood pressure is 80 or higher. Take medicines only as told by your health care provider. Follow the directions carefully. Blood pressure medicines must be taken as prescribed. The medicine does not work as well when you skip doses. Skipping doses also puts you at risk for problems. Contact a health care provider if:  You think you are having a reaction to medicines you have taken.  You have repeated (recurrent) headaches.  You feel dizzy.  You have swelling in your ankles.  You have trouble with your vision. Get help right away if:  You develop a severe headache or confusion.  You have unusual weakness or numbness, or you feel faint.  You have severe pain in your chest or abdomen.  You vomit repeatedly.  You have trouble breathing. Summary  Hypertension is when the force of blood pumping through your arteries is too strong. If this condition is not controlled, it may put you at risk for serious  complications.  Your personal target blood  pressure may vary depending on your medical conditions, your age, and other factors. For most people, a normal blood pressure is less than 120/80.  Hypertension is managed by lifestyle changes, medicines, or both. Lifestyle changes include weight loss, eating a healthy, low-sodium diet, exercising more, and limiting alcohol. This information is not intended to replace advice given to you by your health care provider. Make sure you discuss any questions you have with your health care provider. Document Revised: 08/26/2018 Document Reviewed: 04/01/2016 Elsevier Patient Education  [place].    How to Take Your Blood Pressure You can take your blood pressure at home with a machine. You may need to check your blood pressure at home:  To check if you have high blood pressure (hypertension).  To check your blood pressure over time.  To make sure your blood pressure medicine is working. Supplies needed: You will need a blood pressure machine, or monitor. You can buy one at a drugstore or online. When choosing one:  Choose one with an arm cuff.  Choose one that wraps around your upper arm. Only one finger should fit between your arm and the cuff.  Do not choose one that measures your blood pressure from your wrist or finger. Your doctor can suggest a monitor. How to prepare Avoid these things for 30 minutes before checking your blood pressure:  Drinking caffeine.  Drinking alcohol.  Eating.  Smoking.  Exercising. Five minutes before checking your blood pressure:  Pee.  Sit in a dining chair. Avoid sitting in a soft couch or armchair.  Be quiet. Do not talk. How to take your blood pressure Follow the instructions that came with your machine. If you have a digital blood pressure monitor, these may be the instructions: 1. Sit up straight. 2. Place your feet on the floor. Do not cross your ankles or legs. 3. Rest your left arm at the level of your heart. You may rest it  on a table, desk, or chair. 4. Pull up your shirt sleeve. 5. Wrap the blood pressure cuff around the upper part of your left arm. The cuff should be 1 inch (2.5 cm) above your elbow. It is best to wrap the cuff around bare skin. 6. Fit the cuff snugly around your arm. You should be able to place only one finger between the cuff and your arm. 7. Put the cord inside the groove of your elbow. 8. Press the power button. 9. Sit quietly while the cuff fills with air and loses air. 10. Write down the numbers on the screen. 11. Wait 2-3 minutes and then repeat steps 1-10. What do the numbers mean? Two numbers make up your blood pressure. The first number is called systolic pressure. The second is called diastolic pressure. An example of a blood pressure reading is "120 over 80" (or 120/80). If you are an adult and do not have a medical condition, use this guide to find out if your blood pressure is normal: Normal  First number: below 120.  Second number: below 80. Elevated  First number: 120-129.  Second number: below 80. Hypertension stage 1  First number: 130-139.  Second number: 80-89. Hypertension stage 2  First number: 140 or above.  Second number: 54 or above. Your blood pressure is above normal even if only the top or bottom number is above normal. Follow these instructions at home:  Check your blood pressure as often as your doctor tells you  to.  Take your monitor to your next doctor's appointment. Your doctor will: ? Make sure you are using it correctly. ? Make sure it is working right.  Make sure you understand what your blood pressure numbers should be.  Tell your doctor if your medicines are causing side effects. Contact a doctor if:  Your blood pressure keeps being high. Get help right away if:  Your first blood pressure number is higher than 180.  Your second blood pressure number is higher than 120. This information is not intended to replace advice given to  you by your health care provider. Make sure you discuss any questions you have with your health care provider. Document Revised: 04/16/2017 Document Reviewed: 10/11/2015 Elsevier Patient Education  2020 [employer].

## 2019-10-04 LAB — COMPLETE METABOLIC PANEL WITH GFR
AG Ratio: 1.4 (calc) (ref 1.0–2.5)
ALT: 16 U/L (ref 6–29)
AST: 16 U/L (ref 10–35)
Albumin: 4.2 g/dL (ref 3.6–5.1)
Alkaline phosphatase (APISO): 89 U/L (ref 37–153)
BUN: 8 mg/dL (ref 7–25)
CO2: 29 mmol/L (ref 20–32)
Calcium: 9.7 mg/dL (ref 8.6–10.4)
Chloride: 104 mmol/L (ref 98–110)
Creat: 0.64 mg/dL (ref 0.50–1.05)
GFR, Est African American: 116 mL/min/{1.73_m2} (ref >=60)
GFR, Est Non African American: 100 mL/min/{1.73_m2} (ref >=60)
Globulin: 3 g/dL (calc) (ref 1.9–3.7)
Glucose, Bld: 81 mg/dL (ref 65–99)
Potassium: 4 mmol/L (ref 3.5–5.3)
Sodium: 140 mmol/L (ref 135–146)
Total Bilirubin: 0.5 mg/dL (ref 0.2–1.2)
Total Protein: 7.2 g/dL (ref 6.1–8.1)

## 2019-10-04 LAB — TSH: TSH: 0.45 mIU/L (ref 0.40–4.50)

## 2019-12-04 ENCOUNTER — Other Ambulatory Visit: Payer: Self-pay

## 2019-12-04 ENCOUNTER — Ambulatory Visit (INDEPENDENT_AMBULATORY_CARE_PROVIDER_SITE_OTHER): Payer: BC Managed Care – PPO | Admitting: Family Medicine

## 2019-12-04 ENCOUNTER — Encounter: Payer: Self-pay | Admitting: Family Medicine

## 2019-12-04 VITALS — BP 128/78 | HR 92 | Temp 97.6°F | Resp 16 | Ht 62.0 in | Wt 181.8 lb

## 2019-12-04 DIAGNOSIS — E669 Obesity, unspecified: Secondary | ICD-10-CM | POA: Diagnosis not present

## 2019-12-04 DIAGNOSIS — R635 Abnormal weight gain: Secondary | ICD-10-CM

## 2019-12-04 DIAGNOSIS — Z6833 Body mass index (BMI) 33.0-33.9, adult: Secondary | ICD-10-CM

## 2019-12-04 DIAGNOSIS — R030 Elevated blood-pressure reading, without diagnosis of hypertension: Secondary | ICD-10-CM | POA: Diagnosis not present

## 2019-12-04 DIAGNOSIS — M25511 Pain in right shoulder: Secondary | ICD-10-CM

## 2019-12-04 MED ORDER — MELOXICAM 7.5 MG PO TABS: 7.5000 mg | ORAL_TABLET | Freq: Every day | ORAL | 1 refills | Status: DC | PRN

## 2019-12-04 NOTE — Progress Notes (Signed)
Name: Nashira Mcglynn   MRN: [id number]    DOB: 1966-04-25   Date:12/04/2019       Progress Note  Chief Complaint  Patient presents with  . Follow-up  . Hypertension  . weight management     Subjective:   Maame Dack is a 54 y.o. female, presents to clinic for   Hypertension: managed with lifestyle BP better  Blood pressure today is well controlled. BP Readings from Last 3 Encounters:  12/04/19 128/78  10/03/19 132/84  12/06/18 130/88  Pt denies CP, SOB, exertional sx, LE edema, palpitation, Ha's, visual disturbances Dietary efforts for BP?  Trying to be healthy     Obesity Not eating all day usually doesn't eat by 2pm- then seems like she just "looks at food and gains weight" She has gained over the past 2 years, tsh was normal.  She used to exercise a lot more prior to covid.  Wt Readings from Last 5 Encounters:  12/04/19 181 lb 12.8 oz (82.5 kg)  10/03/19 181 lb 14.4 oz (82.5 kg)  12/06/18 183 lb (83 kg)  03/07/18 164 lb 8 oz (74.6 kg)  02/07/18 164 lb (74.4 kg)   BMI Readings from Last 5 Encounters:  12/04/19 33.25 kg/m  10/03/19 33.27 kg/m  12/06/18 33.47 kg/m  03/07/18 30.09 kg/m  02/07/18 30.00 kg/m  Foods /calories - not tracking or monitoring calorie intake and she has not tried any plans - would like to talk to someone who can help tell her what she should eat  Exercising - she has a plan to start an exercising class tonight - doing dance cardio   Recurrent right shoulder pain, RHD  In the past she did PT and had right shoulder pain that significantly improved after PT That was 5 years ago, similar pain is back - she is doing home exercises and stretches, applying ice and sometimes heat She's trying to take days off to allow her shoulder time to rest, this has seemed to help a little bit      Current Outpatient Medications:  .  cholecalciferol (VITAMIN D3) 25 MCG (1000 UNIT) tablet, Take 1,000 Units by mouth daily., Disp: , Rfl:    Patient Active Problem List   Diagnosis Date Noted  . S/P hysterectomy 01/26/2018  . Constipation 07/10/2013  . Vitamin D deficiency 07/10/2013    Past Surgical History:  Procedure Laterality Date  . ABDOMINAL HYSTERECTOMY    . SALPINGOOPHORECTOMY Bilateral 01/26/2018   Procedure: BILATERAL SALPINGO OOPHORECTOMY;  Surgeon: Florian Buff, MD;  Location: [facility];  Service: Gynecology;  Laterality: Bilateral;  . SUPRACERVICAL ABDOMINAL HYSTERECTOMY N/A 01/26/2018   Procedure: HYSTERECTOMY SUPRACERVICAL ABDOMINAL;  Surgeon: Florian Buff, MD;  Location: [facility];  Service: Gynecology;  Laterality: N/A;  . TUBAL LIGATION      Family History  Problem Relation Age of Onset  . Hypertension Mother   . Mental illness Mother        Depression  . Cancer Father        prostate   . Hypertension Sister   . Hypertension Sister   . Hypertension Sister   . Breast cancer Neg Hx     Social History   Tobacco Use  . Smoking status: Never Smoker  . Smokeless tobacco: Never Used  Vaping Use  . Vaping Use: Never used  Substance Use Topics  . Alcohol use: No    Alcohol/week: 0.0 standard drinks  . Drug use: No     No Known  Allergies  Health Maintenance  Topic Date Due  . TETANUS/TDAP  10/02/2020 (Originally 08/19/1982)  . INFLUENZA VACCINE  12/17/2019  . MAMMOGRAM  10/01/2020  . PAP SMEAR-Modifier  11/25/2020  . COLONOSCOPY  09/09/2023  . COVID-19 Vaccine  Completed  . Hepatitis C Screening  Completed  . HIV Screening  Completed    Chart Review Today: I personally reviewed active problem list, medication list, allergies, family history, social history, health maintenance, notes from last encounter, lab results, imaging with the patient/caregiver today.   Review of Systems  10 Systems reviewed and are negative for acute change except as noted in the HPI.  Objective:   Vitals:   12/04/19 1357  BP: 128/78  Pulse: 92  Resp: 16  Temp: 97.6 F (36.4 C)  TempSrc: Temporal   SpO2: 96%  Weight: 181 lb 12.8 oz (82.5 kg)  Height: 5\' 2"  (1.575 m)    Body mass index is 33.25 kg/m.  Physical Exam Vitals and nursing note reviewed.  Constitutional:      General: She is not in acute distress.    Appearance: She is obese. She is not ill-appearing, toxic-appearing or diaphoretic.  Cardiovascular:     Rate and Rhythm: Normal rate and regular rhythm.     Pulses: Normal pulses.     Heart sounds: Normal heart sounds. No murmur heard.  No friction rub. No gallop.   Pulmonary:     Effort: Pulmonary effort is normal.     Breath sounds: Normal breath sounds.  Abdominal:     General: Bowel sounds are normal.     Palpations: Abdomen is soft.  Musculoskeletal:     Right shoulder: Tenderness (generalized) present. No swelling, deformity, effusion, bony tenderness or crepitus. Normal range of motion. Normal strength. Normal pulse.     Right lower leg: No edema.     Left lower leg: No edema.  Skin:    General: Skin is warm and dry.  Neurological:     Mental Status: She is alert.     Motor: No weakness.     Gait: Gait normal.  Psychiatric:        Mood and Affect: Mood normal.        Behavior: Behavior normal.         Assessment & Plan:     ICD-10-CM   1. Class 1 obesity without serious comorbidity with body mass index (BMI) of 33.0 to 33.9 in adult, unspecified obesity type  E66.9 Amb ref to Medical Nutrition Therapy-[facility]   Z68.33    encouraged healthy snacks and meals throughout day, track calories, f/up dietician, great idea to start exercise classes again  2. Elevated BP without diagnosis of hypertension  R03.0    BP well controlled and at goal today, continue healthy lifestyle efforts  3. Weight gain  R63.5 Amb ref to Medical Nutrition Therapy[facility]   sig weight gain over the past 2 years, likely COVID decreased activity and poor nutrition contributory  4. Right shoulder pain, unspecified chronicity  M25.511 Ambulatory referral to Physical Therapy    recurrent right shoulder pain, RHD, repetitive/overuse with profession, pt requests referral to PT   Spent significant amount of time today with pt discussing healthy diet and exercise habits, advised her to track and record calorie intake so it will help dietician address needs  Return for CPE in 3-4 months.   Delsa Grana, PA-C 12/04/19 2:05 PM

## 2019-12-26 ENCOUNTER — Ambulatory Visit: Payer: BC Managed Care – PPO | Attending: Family Medicine

## 2019-12-28 ENCOUNTER — Ambulatory Visit: Payer: BC Managed Care – PPO

## 2020-01-02 ENCOUNTER — Ambulatory Visit: Payer: BC Managed Care – PPO

## 2020-01-04 ENCOUNTER — Ambulatory Visit: Payer: BC Managed Care – PPO

## 2020-01-08 ENCOUNTER — Encounter: Payer: BC Managed Care – PPO | Attending: Family Medicine | Admitting: Dietician

## 2020-01-08 ENCOUNTER — Other Ambulatory Visit: Payer: Self-pay

## 2020-01-08 VITALS — Ht 62.0 in | Wt 182.1 lb

## 2020-01-08 DIAGNOSIS — E669 Obesity, unspecified: Secondary | ICD-10-CM | POA: Diagnosis not present

## 2020-01-08 DIAGNOSIS — Z6833 Body mass index (BMI) 33.0-33.9, adult: Secondary | ICD-10-CM

## 2020-01-08 NOTE — Progress Notes (Signed)
Medical Nutrition Therapy: Visit start time: 1100  end time: 1200  Assessment:  Diagnosis: overweight Past medical history: GERD Psychosocial issues/ stress concerns: none  Preferred learning method:  . Auditory . Visual . Hands-on   Current weight: 182.1lbs with shoes  Height: 5'2" Medications, supplements: reconciled list in medical record  Progress and evaluation:   Patient reports weight has been close to 180lbs in recent months. She would like to be 150lbs or less.   Physical activity has decreased since pandemic.   Has not done any significant dieting in the past   Works 10-12 hour shifts 4-5 days per week -- not much time for cooking; patient states she is unable to cook much, but occasionally prepares crock pot meals to last for 2-3 days. She has a friend that owns a restaurant and will bring plated meals to her about 3 times a week.  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 Dining out frequency: 10-12 meals per week.  Breakfast: drinks water often skips due to starting work; eggs occ with 2 strips bacon and coffee takeout; used to boil eggs for the week.  Snack: rarely -- peanut butter crackers, snack bag popcorn, almonds Lunch: meat and 2 veg ie cabbage, corn, pork chop; shrimp, potato, corn, sausage -- friend cooks and brings plate meal about 3x a week; sometimes skips, or will eat as late as 2-3pm due to work schedule. Snack: none or same as am Supper: leftovers from lunch; takeout from deli ie chicken drummettes 3-4,1/2-sweet tea (once daily max) Snack: occasionally fruit Beverages: water, coffee on workdays, herbalife tea with aloe, occasionally 1/2-sweet tea  Nutrition Care Education: Topics covered:  Basic nutrition: basic food groups, appropriate nutrient balance, appropriate meal and snack schedule, general nutrition guidelines     Weight control: identifying healthy weight, importance of low sugar and low fat choices, estimated energy needs for weight loss at 1300kcal, provided guidance for 45% CHO, 25% protein, and 30% fat; tracking food intake; role of exercise/ physical activity. Advanced nutrition: dining out-- avoiding fried foods, extra sources of fat, sugar, and portion control   Nutritional Diagnosis:  Green-3.3 Overweight/obesity As related to frequent restaurant meals and erratic eating pattern, limited physical activity.  As evidenced by patient with current BMI of 33.3.  Intervention:   Instruction and discussion as noted above.  Patient has been working to reduce food portions and eat at regular intervals. She reports struggling to increase physcial activity.   Established goals for further change, with direction from patient.  Education Materials given:  . Plate Planner with food lists, sample meal pattern . Sample menus . Goals/ instructions   Learner/ who was taught:  . Patient    Level of understanding: Marland Kitchen Verbalizes/ demonstrates competency   Demonstrated degree of understanding via:   Teach back Learning barriers: . None  Willingness to learn/ readiness for change: . Eager, change in progress  Monitoring and Evaluation:  Dietary intake, exercise, and body weight      follow up: 02/19/20 at 2:30pm

## 2020-01-08 NOTE — Patient Instructions (Signed)
   Good job working on [employer], keep up the great work!  Gradually resume regular exercise -- start with a short time such as 10 minutes, and gradually increase as energy picks up. Make definite goal to start.   Consider tracking food intake to monitor calories, carbs, etc. Goal for calories is 1300-1400, and carb goal is 120-150grams daily.

## 2020-02-09 NOTE — Progress Notes (Signed)
Patient: Pamela Nichols, Female    DOB: 27-Aug-1965, 54 y.o.   MRN: [id number] Delsa Grana, PA-C Visit Date: 02/12/2020  Today's Provider: Delsa Grana, PA-C   Chief Complaint  Patient presents with  . Annual Exam   Subjective:   Annual physical exam:  Pamela Nichols is a 54 y.o. female who presents today for complete physical exam:  Exercise/Activity:  Walking 1-2 a week Diet/nutrition:  Saw dietician, trying to eat healthier  Sleep:  Sleeps ok   Wt Readings from Last 5 Encounters:  02/12/20 180 lb 8 oz (81.9 kg)  01/08/20 182 lb 1.6 oz (82.6 kg)  12/04/19 181 lb 12.8 oz (82.5 kg)  10/03/19 181 lb 14.4 oz (82.5 kg)  12/06/18 183 lb (83 kg)   BMI Readings from Last 5 Encounters:  02/12/20 33.01 kg/m  01/08/20 33.31 kg/m  12/04/19 33.25 kg/m  10/03/19 33.27 kg/m  12/06/18 33.47 kg/m   USPSTF grade A and B recommendations - reviewed and addressed today  Depression:  Phq 9 completed today by patient, was reviewed by me with patient in the room PHQ score is neg, pt feels good PHQ 2/9 Scores 02/12/2020 01/08/2020 12/04/2019 10/03/2019  PHQ - 2 Score 0 0 0 1  PHQ- 9 Score - - 0 1   Depression screen [facility] 2/9 02/12/2020 01/08/2020 12/04/2019 10/03/2019 12/06/2018  Decreased Interest 0 0 0 0 0  Down, Depressed, Hopeless 0 0 0 1 0  PHQ - 2 Score 0 0 0 1 0  Altered sleeping - - 0 0 -  Tired, decreased energy - - 0 0 -  Change in appetite - - 0 0 -  Feeling bad or failure about yourself  - - 0 0 -  Trouble concentrating - - 0 0 -  Moving slowly or fidgety/restless - - 0 0 -  Suicidal thoughts - - 0 0 -  PHQ-9 Score - - 0 1 -  Difficult doing work/chores - - Not difficult at all Not difficult at all -    Alcohol screening:   Office Visit from 10/03/2019 in [facility]  AUDIT-C Score 0      Immunizations and Health Maintenance: Health Maintenance  Topic Date Due  . INFLUENZA VACCINE  08/15/2020 (Originally 12/17/2019)  . TETANUS/TDAP   10/02/2020 (Originally 08/19/1982)  . MAMMOGRAM  10/01/2020  . PAP SMEAR-Modifier  11/25/2020  . COLONOSCOPY  09/09/2023  . COVID-19 Vaccine  Completed  . Hepatitis C Screening  Completed  . HIV Screening  Completed     Hep C Screening: 12/06/18  STD testing and prevention (HIV/chl/gon/syphilis):HIV completed on 10/30/11  see above, no additional testing desired by pt today  Intimate partner violence:  None, denies   Sexual History/Pain during Intercourse: Married, no concerns with sex   Menstrual History/LMP/Abnormal Bleeding:  No change no abnormal bleeding  Patient's last menstrual period was 04/02/2014.  Incontinence Symptoms:   none  Breast cancer:  Last Mammogram: 10/02/19 see HM list above BRCA gene screening:  None known   Cervical cancer screening: UTD Pt denies family hx of cancers - breast, ovarian, uterine, colon:    Dad died of cancer - not sure what kind  Osteoporosis:    TAH-BSO Discussion on osteoporosis per age, including high calcium and vitamin D supplementation, weight bearing exercises Pt is not supplementing with daily calcium/Vit D.  Skin cancer:  Hx of skin CA -  NO Discussed atypical lesions   Colorectal cancer:   Colonoscopy is UTD Discussed  concerning signs and sx of CRC, pt denies melena UTD on screening   Lung cancer:   Low Dose CT Chest recommended if Age 50-80 years, 30 pack-year currently smoking OR have quit w/in 15years. Patient does not qualify.    Social History   Tobacco Use  . Smoking status: Never Smoker  . Smokeless tobacco: Never Used  Vaping Use  . Vaping Use: Never used  Substance Use Topics  . Alcohol use: No    Alcohol/week: 0.0 standard drinks  . Drug use: No       Office Visit from 10/03/2019 in [facility]  AUDIT-C Score 0      Family History  Problem Relation Age of Onset  . Hypertension Mother   . Mental illness Mother        Depression  . Cancer Father        prostate   .  Hypertension Sister   . Hypertension Sister   . Hypertension Sister   . Breast cancer Neg Hx      Blood pressure/Hypertension: BP Readings from Last 3 Encounters:  02/12/20 122/74  12/04/19 128/78  10/03/19 132/84    Weight/Obesity: Wt Readings from Last 3 Encounters:  02/12/20 180 lb 8 oz (81.9 kg)  01/08/20 182 lb 1.6 oz (82.6 kg)  12/04/19 181 lb 12.8 oz (82.5 kg)   BMI Readings from Last 3 Encounters:  02/12/20 33.01 kg/m  01/08/20 33.31 kg/m  12/04/19 33.25 kg/m     Lipids:  Lab Results  Component Value Date   CHOL 184 12/06/2018   CHOL 191 09/09/2015   CHOL 167 07/18/2013   Lab Results  Component Value Date   HDL 76 12/06/2018   HDL 74.20 09/09/2015   HDL 62.80 07/18/2013   Lab Results  Component Value Date   LDLCALC 94 12/06/2018   LDLCALC 103 (H) 09/09/2015   LDLCALC 92 07/18/2013   Lab Results  Component Value Date   TRIG 54 12/06/2018   TRIG 71.0 09/09/2015   TRIG 62.0 07/18/2013   Lab Results  Component Value Date   CHOLHDL 2.4 12/06/2018   CHOLHDL 3 09/09/2015   CHOLHDL 3 07/18/2013   No results found for: LDLDIRECT Based on the results of lipid panel his/her cardiovascular risk factor ( using [employer] )  in the next 10 years is: The 10-year ASCVD risk score Mikey Bussing DC Brooke Bonito., et al., 2013) is: 2.1%   Values used to calculate the score:     Age: 12 years     Sex: Female     Is Non-Hispanic African American: Yes     Diabetic: No     Tobacco smoker: No     Systolic Blood Pressure: 208 mmHg     Is BP treated: No     HDL Cholesterol: 76 mg/dL     Total Cholesterol: 184 mg/dL Glucose:  Glucose, Bld  Date Value Ref Range Status  10/03/2019 81 65 - 99 mg/dL Final    Comment:    .            Fasting reference interval .   12/06/2018 88 65 - 99 mg/dL Final    Comment:    .            Fasting reference interval .   01/27/2018 113 (H) 70 - 99 mg/dL Final   Hypertension: BP Readings from Last 3 Encounters:  02/12/20 122/74    12/04/19 128/78  10/03/19 132/84   Obesity: Wt Readings from  Last 3 Encounters:  02/12/20 180 lb 8 oz (81.9 kg)  01/08/20 182 lb 1.6 oz (82.6 kg)  12/04/19 181 lb 12.8 oz (82.5 kg)   BMI Readings from Last 3 Encounters:  02/12/20 33.01 kg/m  01/08/20 33.31 kg/m  12/04/19 33.25 kg/m      Advanced Care Planning:  A voluntary discussion about advance care planning including the explanation and discussion of advance directives.   Discussed health care proxy and Living will, and the patient was able to identify a health care proxy as husband - .   Patient does not have a living will at present time.   Social History      She        Social History   Socioeconomic History  . Marital status: Married    Spouse name: Not on file  . Number of children: 2  . Years of education: 28  . Highest education level: Not on file  Occupational History  . Occupation: Hair Stylist  Tobacco Use  . Smoking status: Never Smoker  . Smokeless tobacco: Never Used  Vaping Use  . Vaping Use: Never used  Substance and Sexual Activity  . Alcohol use: No    Alcohol/week: 0.0 standard drinks  . Drug use: No  . Sexual activity: Yes    Birth control/protection: Surgical, Post-menopausal  Other Topics Concern  . Not on file  Social History Narrative   Doneisha grew up in [place]. She is married and lives in [place] with her husband. She works as a Probation officer. She and her husband are raising their nephew that is 22 years old. She has 2 adult children from a previous marriage. Her children live in [place]. She enjoys being outdoors. She also enjoys relaxing and watching TV.   Social Determinants of Health   Financial Resource Strain:   . Difficulty of Paying Living Expenses: Not on file  Food Insecurity:   . Worried About Charity fundraiser in the Last Year: Not on file  . Ran Out of Food in the Last Year: Not on file  Transportation Needs:   . Lack of Transportation  (Medical): Not on file  . Lack of Transportation (Non-Medical): Not on file  Physical Activity:   . Days of Exercise per Week: Not on file  . Minutes of Exercise per Session: Not on file  Stress:   . Feeling of Stress : Not on file  Social Connections:   . Frequency of Communication with Friends and Family: Not on file  . Frequency of Social Gatherings with Friends and Family: Not on file  . Attends Religious Services: Not on file  . Active Member of Clubs or Organizations: Not on file  . Attends Archivist Meetings: Not on file  . Marital Status: Not on file    Family History        Family History  Problem Relation Age of Onset  . Hypertension Mother   . Mental illness Mother        Depression  . Cancer Father        prostate   . Hypertension Sister   . Hypertension Sister   . Hypertension Sister   . Breast cancer Neg Hx     Patient Active Problem List   Diagnosis Date Noted  . S/P hysterectomy 01/26/2018  . Constipation 07/10/2013  . Vitamin D deficiency 07/10/2013    Past Surgical History:  Procedure Laterality Date  . ABDOMINAL HYSTERECTOMY    .  SALPINGOOPHORECTOMY Bilateral 01/26/2018   Procedure: BILATERAL SALPINGO OOPHORECTOMY;  Surgeon: Florian Buff, MD;  Location: [facility];  Service: Gynecology;  Laterality: Bilateral;  . SUPRACERVICAL ABDOMINAL HYSTERECTOMY N/A 01/26/2018   Procedure: HYSTERECTOMY SUPRACERVICAL ABDOMINAL;  Surgeon: Florian Buff, MD;  Location: [facility];  Service: Gynecology;  Laterality: N/A;  . TUBAL LIGATION       Current Outpatient Medications:  .  acetaminophen (TYLENOL) 325 MG tablet, Take 650 mg by mouth every 6 (six) hours as needed., Disp: , Rfl:  .  cholecalciferol (VITAMIN D3) 25 MCG (1000 UNIT) tablet, Take 1,000 Units by mouth daily., Disp: , Rfl:  .  Ibuprofen 200 MG CAPS, Take by mouth., Disp: , Rfl:  .  meloxicam (MOBIC) 7.5 MG tablet, Take 1 tablet (7.5 mg total) by mouth daily as needed for pain., Disp: 30  tablet, Rfl: 1  No Known Allergies  Patient Care Team: Delsa Grana, PA-C as PCP - General (Family Medicine)  Review of Systems  10 Systems reviewed and are negative for acute change except as noted in the HPI.    I personally reviewed active problem list, medication list, allergies, family history, social history, health maintenance, notes from last encounter, lab results, imaging with the patient/caregiver today.        Objective:   Vitals:  Vitals:   02/12/20 1122  BP: 122/74  Pulse: 90  Resp: 16  Temp: 98.5 F (36.9 C)  TempSrc: Oral  SpO2: 97%  Weight: 180 lb 8 oz (81.9 kg)  Height: _0  (1.575 m)    Body mass index is 33.01 kg/m.  Physical Exam Vitals and nursing note reviewed.  Constitutional:      General: She is not in acute distress.    Appearance: Normal appearance. She is well-developed. She is obese. She is not ill-appearing, toxic-appearing or diaphoretic.     Interventions: Face mask in place.  HENT:     Head: Normocephalic and atraumatic.     Right Ear: External ear normal.     Left Ear: External ear normal.     Nose: Nose normal.     Mouth/Throat:     Mouth: Mucous membranes are moist.  Eyes:     General: Lids are normal. No scleral icterus.       Right eye: No discharge.        Left eye: No discharge.     Conjunctiva/sclera: Conjunctivae normal.     Pupils: Pupils are equal, round, and reactive to light.  Neck:     Trachea: Phonation normal. No tracheal deviation.  Cardiovascular:     Rate and Rhythm: Normal rate and regular rhythm.     Pulses: Normal pulses.          Radial pulses are 2+ on the right side and 2+ on the left side.       Posterior tibial pulses are 2+ on the right side and 2+ on the left side.     Heart sounds: Normal heart sounds. No murmur heard.  No friction rub. No gallop.   Pulmonary:     Effort: Pulmonary effort is normal. No respiratory distress.     Breath sounds: Normal breath sounds. No stridor. No wheezing,  rhonchi or rales.  Chest:     Chest wall: No tenderness.  Abdominal:     General: Bowel sounds are normal. There is no distension.     Palpations: Abdomen is soft.     Tenderness: There is no abdominal tenderness. There  is no right CVA tenderness, left CVA tenderness or guarding.  Musculoskeletal:     Cervical back: Normal range of motion and neck supple. No rigidity.     Right lower leg: No edema.     Left lower leg: No edema.  Skin:    General: Skin is warm and dry.     Capillary Refill: Capillary refill takes less than 2 seconds.     Coloration: Skin is not jaundiced or pale.     Findings: No rash.  Neurological:     Mental Status: She is alert. Mental status is at baseline.     Motor: No abnormal muscle tone.     Gait: Gait normal.  Psychiatric:        Mood and Affect: Mood normal.        Speech: Speech normal.        Behavior: Behavior normal.       Fall Risk: Fall Risk  02/12/2020 01/08/2020 12/04/2019 10/03/2019 12/07/2018  Falls in the past year? 0 0 0 0 0  Number falls in past yr: 0 - 0 0 0  Injury with Fall? 0 - 0 0 0  Follow up Falls evaluation completed - - - Falls evaluation completed    Functional Status Survey: Is the patient deaf or have difficulty hearing?: No Does the patient have difficulty seeing, even when wearing glasses/contacts?: No Does the patient have difficulty concentrating, remembering, or making decisions?: No Does the patient have difficulty walking or climbing stairs?: No Does the patient have difficulty dressing or bathing?: No Does the patient have difficulty doing errands alone such as visiting a doctor's office or shopping?: No   Assessment & Plan:    CPE completed today  . USPSTF grade A and B recommendations reviewed with patient; age-appropriate recommendations, preventive care, screening tests, etc discussed and encouraged; healthy living encouraged; see AVS for patient education given to patient  . Discussed importance of 150  minutes of physical activity weekly, AHA exercise recommendations given to pt in AVS/handout  . Discussed importance of healthy diet:  eating lean meats and proteins, avoiding trans fats and saturated fats, avoid simple sugars and excessive carbs in diet, eat 6 servings of fruit/vegetables daily and drink plenty of water and avoid sweet beverages.    . Recommended pt to do annual eye exam and routine dental exams/cleanings  . Depression, alcohol, fall screening completed as documented above and per flowsheets  . Reviewed Health Maintenance: Health Maintenance  Topic Date Due  . INFLUENZA VACCINE  08/15/2020 (Originally 12/17/2019)  . TETANUS/TDAP  10/02/2020 (Originally 08/19/1982)  . MAMMOGRAM  10/01/2020  . PAP SMEAR-Modifier  11/25/2020  . COLONOSCOPY  09/09/2023  . COVID-19 Vaccine  Completed  . Hepatitis C Screening  Completed  . HIV Screening  Completed    . Immunizations: Immunization History  Administered Date(s) Administered  . Moderna SARS-COVID-2 Vaccination 07/19/2019, 08/16/2019   1. Annual physical exam - Lipid panel - COMPLETE METABOLIC PANEL WITH GFR - CBC w/Diff/Platelet  2. Class 1 obesity without serious comorbidity with body mass index (BMI) of 33.0 to 33.9 in adult, unspecified obesity type Working on diet and increasing exercise   3. Neck pain Still neck pain limited ROM, muscles tight into right shoulder  - Ambulatory referral to Physical Therapy  4. Right shoulder pain, unspecified chronicity Same as last, would like to do PT - Ambulatory referral to Physical Therapy   Delsa Grana, PA-C 02/12/20 11:36 AM  [facility]

## 2020-02-12 ENCOUNTER — Encounter: Payer: Self-pay | Admitting: Family Medicine

## 2020-02-12 ENCOUNTER — Other Ambulatory Visit: Payer: Self-pay

## 2020-02-12 ENCOUNTER — Ambulatory Visit (INDEPENDENT_AMBULATORY_CARE_PROVIDER_SITE_OTHER): Payer: BC Managed Care – PPO | Admitting: Family Medicine

## 2020-02-12 VITALS — BP 122/74 | HR 90 | Temp 98.5°F | Resp 16 | Ht 62.0 in | Wt 180.5 lb

## 2020-02-12 DIAGNOSIS — Z0000 Encounter for general adult medical examination without abnormal findings: Secondary | ICD-10-CM

## 2020-02-12 DIAGNOSIS — M25511 Pain in right shoulder: Secondary | ICD-10-CM | POA: Diagnosis not present

## 2020-02-12 DIAGNOSIS — Z23 Encounter for immunization: Secondary | ICD-10-CM | POA: Diagnosis not present

## 2020-02-12 DIAGNOSIS — M542 Cervicalgia: Secondary | ICD-10-CM

## 2020-02-12 DIAGNOSIS — E669 Obesity, unspecified: Secondary | ICD-10-CM | POA: Diagnosis not present

## 2020-02-12 DIAGNOSIS — Z6833 Body mass index (BMI) 33.0-33.9, adult: Secondary | ICD-10-CM

## 2020-02-12 LAB — COMPLETE METABOLIC PANEL WITH GFR
AG Ratio: 1.5 (calc) (ref 1.0–2.5)
ALT: 11 U/L (ref 6–29)
AST: 13 U/L (ref 10–35)
Albumin: 4.1 g/dL (ref 3.6–5.1)
Alkaline phosphatase (APISO): 85 U/L (ref 37–153)
BUN: 8 mg/dL (ref 7–25)
CO2: 27 mmol/L (ref 20–32)
Calcium: 9.3 mg/dL (ref 8.6–10.4)
Chloride: 107 mmol/L (ref 98–110)
Creat: 0.62 mg/dL (ref 0.50–1.05)
GFR, Est African American: 117 mL/min/{1.73_m2} (ref >=60)
GFR, Est Non African American: 101 mL/min/{1.73_m2} (ref >=60)
Globulin: 2.8 g/dL (calc) (ref 1.9–3.7)
Glucose, Bld: 85 mg/dL (ref 65–99)
Potassium: 4.1 mmol/L (ref 3.5–5.3)
Sodium: 141 mmol/L (ref 135–146)
Total Bilirubin: 0.6 mg/dL (ref 0.2–1.2)
Total Protein: 6.9 g/dL (ref 6.1–8.1)

## 2020-02-12 LAB — LIPID PANEL
Cholesterol: 199 mg/dL (ref <200)
HDL: 80 mg/dL (ref >=50)
LDL Cholesterol (Calc): 100 mg/dL (calc) — ABNORMAL HIGH
Non-HDL Cholesterol (Calc): 119 mg/dL (calc) (ref <130)
Total CHOL/HDL Ratio: 2.5 (calc) (ref <5.0)
Triglycerides: 95 mg/dL (ref <150)

## 2020-02-12 LAB — CBC WITH DIFFERENTIAL/PLATELET
Absolute Monocytes: 269 cells/uL (ref 200–950)
Basophils Absolute: 39 cells/uL (ref 0–200)
Basophils Relative: 1 %
Eosinophils Absolute: 121 cells/uL (ref 15–500)
Eosinophils Relative: 3.1 %
HCT: 39.6 % (ref 35.0–45.0)
Hemoglobin: 12.6 g/dL (ref 11.7–15.5)
Lymphs Abs: 2024 cells/uL (ref 850–3900)
MCH: 28.2 pg (ref 27.0–33.0)
MCHC: 31.8 g/dL — ABNORMAL LOW (ref 32.0–36.0)
MCV: 88.6 fL (ref 80.0–100.0)
MPV: 10.6 fL (ref 7.5–12.5)
Monocytes Relative: 6.9 %
Neutro Abs: 1447 cells/uL — ABNORMAL LOW (ref 1500–7800)
Neutrophils Relative %: 37.1 %
Platelets: 197 10*3/uL (ref 140–400)
RBC: 4.47 10*6/uL (ref 3.80–5.10)
RDW: 13.3 % (ref 11.0–15.0)
Total Lymphocyte: 51.9 %
WBC: 3.9 10*3/uL (ref 3.8–10.8)

## 2020-02-12 NOTE — Patient Instructions (Addendum)
Health Maintenance  Topic Date Due  . Flu Shot  08/15/2020*  . Tetanus Vaccine  10/02/2020*  . Mammogram  10/01/2020  . Pap Smear  11/25/2020  . Colon Cancer Screening  09/09/2023  . COVID-19 Vaccine  Completed  .  Hepatitis C: One time screening is recommended by Center for Disease Control  (CDC) for  adults born from [birth year] through 1967.   Completed  . HIV Screening  Completed  *Topic was postponed. The date shown is not the original due date.     Calcium 1200 mg daily and Vit D 3 1000 IU  You can find over the counter combo   Preventing Osteoporosis, Adult Osteoporosis is a condition that causes the bones to lose density. This means that the bones become thinner, and the normal spaces in bone tissue become larger. Low bone density can make the bones weak and cause them to break more easily. Osteoporosis cannot always be prevented, but you can take steps to lower your risk of developing this condition. How can this condition affect me? If you develop osteoporosis, you will be more likely to break bones in your wrist, spine, or hip. Even a minor accident or injury can be enough to break weak bones. The bones will also be slower to heal. Osteoporosis can cause other problems as well, such as a stooped posture or trouble with movement. Osteoporosis can occur with aging. As you get older, you may lose bone tissue more quickly, or it may be replaced more slowly. Osteoporosis is more likely to develop if you have poor nutrition or do not get enough calcium or vitamin D. Other lifestyle factors can also play a role. By eating a well-balanced diet and making lifestyle changes, you can help keep your bones strong and healthy, lowering your chances of developing osteoporosis. What can increase my risk? The following factors may make you more likely to develop osteoporosis:  Having a family history of the condition.  Having poor nutrition or not getting enough calcium or vitamin D.  Using certain  medicines, such as steroid medicines or antiseizure medicines.  Being any of the following: ? 69 years of age or older. ? Female. ? A woman who has gone through menopause (is postmenopausal). ? White (Caucasian) or of Asian descent.  Smoking or having a history of smoking.  Not being physically active (being sedentary).  Having a small body frame. What actions can I take to prevent this?  Get enough calcium   Make sure you get enough calcium every day. Calcium is the most important mineral for bone health. Most people can get enough calcium from their diet, but supplements may be recommended for people who are at risk for osteoporosis. Follow these guidelines: ? If you are age 63 or younger, aim to get 1,000 mg of calcium every day. ? If you are older than age 41, aim to get 1,200 mg of calcium every day.  Good sources of calcium include: ? Dairy products, such as low-fat or nonfat milk, cheese, and yogurt. ? Dark green leafy vegetables, such as bok choy and broccoli. ? Foods that have had calcium added to them (calcium-fortified foods), such as orange juice, cereal, bread, soy beverages, and tofu products. ? Nuts, such as almonds.  Check nutrition labels to see how much calcium is in a food or drink. Get enough vitamin D  Try to get enough vitamin D every day. Vitamin D is the most essential vitamin for bone health. It helps the  body absorb calcium. Follow these guidelines for how much vitamin D to get from food: ? If you are age 61 or younger, aim to get at least 600 international units (IU) every day. Your health care provider may suggest more. ? If you are older than age 56, aim to get at least 800 international units every day. Your health care provider may suggest more.  Good sources of vitamin D in your diet include: ? Egg yolks. ? Oily fish, such as salmon, sardines, and tuna. ? Milk and cereal fortified with vitamin D.  Your body also makes vitamin D when you are out  in the sun. Exposing the bare skin on your face, arms, legs, or back to the sun for no more than 30 minutes a day, 2 times a week is more than enough. Beyond that, make sure you use sunblock to protect your skin from sunburn, which increases your risk for skin cancer. Exercise  Stay active and get exercise every day.  Ask your health care provider what types of exercise are best for you. Weight-bearing and strength-building activities are important for building and maintaining healthy bones. Some examples of these types of activities include: ? Walking and hiking. ? Jogging and running. ? Dancing. ? Gym exercises. ? Lifting weights. ? Tennis and racquetball. ? Climbing stairs. ? Aerobics. Make other lifestyle changes  Do not use any products that contain nicotine or tobacco, such as cigarettes, e-cigarettes, and chewing tobacco. If you need help quitting, ask your health care provider.  Lose weight if you are overweight.  If you drink alcohol: ? Limit how much you use to:  0-1 drink a day for nonpregnant women.  0-2 drinks a day for men. ? Be aware of how much alcohol is in your drink. In the [place] one drink equals one 12 oz bottle of beer (355 mL), one 5 oz glass of wine (148 mL), or one 1 oz glass of hard liquor (44 mL). Where to find support If you need help making changes to prevent osteoporosis, talk with your health care provider. You can ask for a referral to a diet and nutrition specialist (dietitian) and a physical therapist. Where to find more information Learn more about osteoporosis from:  NIH Osteoporosis and Related [place]: www.bones.SouthExposed.es  [place] Office on [employer] Health: [place]s.tn  [place]: EquipmentWeekly.com.ee Summary  Osteoporosis is a condition that causes weak bones that are more likely to break.  Eat a healthy diet, making sure you get enough calcium and vitamin D, and stay active by getting  regular exercise to help prevent osteoporosis.  Other ways to reduce your risk of osteoporosis include maintaining a healthy weight and avoiding alcohol and products that contain nicotine or tobacco. This information is not intended to replace advice given to you by your health care provider. Make sure you discuss any questions you have with your health care provider. Document Revised: 12/02/2018 Document Reviewed: 12/02/2018 Elsevier Patient Education  2020 Elsevier Inc.    Preventive Care 52-25 Years Old, Female Preventive care refers to visits with your health care provider and lifestyle choices that can promote health and wellness. This includes:  A yearly physical exam. This may also be called an annual well check.  Regular dental visits and eye exams.  Immunizations.  Screening for certain conditions.  Healthy lifestyle choices, such as eating a healthy diet, getting regular exercise, not using drugs or products that contain nicotine and tobacco, and limiting alcohol use.  What can I expect for my preventive care visit? Physical exam Your health care provider will check your:  Height and weight. This may be used to calculate body mass index (BMI), which tells if you are at a healthy weight.  Heart rate and blood pressure.  Skin for abnormal spots. Counseling Your health care provider may ask you questions about your:  Alcohol, tobacco, and drug use.  Emotional well-being.  Home and relationship well-being.  Sexual activity.  Eating habits.  Work and work Statistician.  Method of birth control.  Menstrual cycle.  Pregnancy history. What immunizations do I need?  Influenza (flu) vaccine  This is recommended every year. Tetanus, diphtheria, and pertussis (Tdap) vaccine  You may need a Td booster every 10 years. Varicella (chickenpox) vaccine  You may need this if you have not been vaccinated. Zoster (shingles) vaccine  You may need this after age  49. Measles, mumps, and rubella (MMR) vaccine  You may need at least one dose of MMR if you were born in 1957 or later. You may also need a second dose. Pneumococcal conjugate (PCV13) vaccine  You may need this if you have certain conditions and were not previously vaccinated. Pneumococcal polysaccharide (PPSV23) vaccine  You may need one or two doses if you smoke cigarettes or if you have certain conditions. Meningococcal conjugate (MenACWY) vaccine  You may need this if you have certain conditions. Hepatitis A vaccine  You may need this if you have certain conditions or if you travel or work in places where you may be exposed to hepatitis A. Hepatitis B vaccine  You may need this if you have certain conditions or if you travel or work in places where you may be exposed to hepatitis B. Haemophilus influenzae type b (Hib) vaccine  You may need this if you have certain conditions. Human papillomavirus (HPV) vaccine  If recommended by your health care provider, you may need three doses over 6 months. You may receive vaccines as individual doses or as more than one vaccine together in one shot (combination vaccines). Talk with your health care provider about the risks and benefits of combination vaccines. What tests do I need? Blood tests  Lipid and cholesterol levels. These may be checked every 5 years, or more frequently if you are over 85 years old.  Hepatitis C test.  Hepatitis B test. Screening  Lung cancer screening. You may have this screening every year starting at age 63 if you have a 30-pack-year history of smoking and currently smoke or have quit within the past 15 years.  Colorectal cancer screening. All adults should have this screening starting at age 28 and continuing until age 80. Your health care provider may recommend screening at age 42 if you are at increased risk. You will have tests every 1-10 years, depending on your results and the type of screening  test.  Diabetes screening. This is done by checking your blood sugar (glucose) after you have not eaten for a while (fasting). You may have this done every 1-3 years.  Mammogram. This may be done every 1-2 years. Talk with your health care provider about when you should start having regular mammograms. This may depend on whether you have a family history of breast cancer.  BRCA-related cancer screening. This may be done if you have a family history of breast, ovarian, tubal, or peritoneal cancers.  Pelvic exam and Pap test. This may be done every 3 years starting at age 63. Starting at age  30, this may be done every 5 years if you have a Pap test in combination with an HPV test. Other tests  Sexually transmitted disease (STD) testing.  Bone density scan. This is done to screen for osteoporosis. You may have this scan if you are at high risk for osteoporosis. Follow these instructions at home: Eating and drinking  Eat a diet that includes fresh fruits and vegetables, whole grains, lean protein, and low-fat dairy.  Take vitamin and mineral supplements as recommended by your health care provider.  Do not drink alcohol if: ? Your health care provider tells you not to drink. ? You are pregnant, may be pregnant, or are planning to become pregnant.  If you drink alcohol: ? Limit how much you have to 0-1 drink a day. ? Be aware of how much alcohol is in your drink. In the [place] one drink equals one 12 oz bottle of beer (355 mL), one 5 oz glass of wine (148 mL), or one 1 oz glass of hard liquor (44 mL). Lifestyle  Take daily care of your teeth and gums.  Stay active. Exercise for at least 30 minutes on 5 or more days each week.  Do not use any products that contain nicotine or tobacco, such as cigarettes, e-cigarettes, and chewing tobacco. If you need help quitting, ask your health care provider.  If you are sexually active, practice safe sex. Use a condom or other form of birth control  (contraception) in order to prevent pregnancy and STIs (sexually transmitted infections).  If told by your health care provider, take low-dose aspirin daily starting at age 6. What's next?  Visit your health care provider once a year for a well check visit.  Ask your health care provider how often you should have your eyes and teeth checked.  Stay up to date on all vaccines. This information is not intended to replace advice given to you by your health care provider. Make sure you discuss any questions you have with your health care provider. Document Revised: 01/13/2018 Document Reviewed: 01/13/2018 Elsevier Patient Education  2020 [employer].

## 2020-02-19 ENCOUNTER — Ambulatory Visit: Payer: BC Managed Care – PPO | Admitting: Dietician

## 2020-03-22 ENCOUNTER — Encounter: Payer: Self-pay | Admitting: Dietician

## 2020-03-22 NOTE — Progress Notes (Signed)
Have not heard back from patient to reschedule her missed appointment from 02/19/20. Sent notification to referring provider.

## 2020-04-01 ENCOUNTER — Ambulatory Visit: Payer: BC Managed Care – PPO | Admitting: Family Medicine

## 2020-04-22 ENCOUNTER — Ambulatory Visit: Payer: BC Managed Care – PPO | Admitting: Family Medicine

## 2020-04-23 ENCOUNTER — Ambulatory Visit: Payer: BC Managed Care – PPO | Admitting: Family Medicine

## 2020-05-20 ENCOUNTER — Encounter: Payer: Self-pay | Admitting: Family Medicine

## 2020-05-20 ENCOUNTER — Telehealth (INDEPENDENT_AMBULATORY_CARE_PROVIDER_SITE_OTHER): Payer: BC Managed Care – PPO | Admitting: Family Medicine

## 2020-05-20 DIAGNOSIS — Z7689 Persons encountering health services in other specified circumstances: Secondary | ICD-10-CM | POA: Diagnosis not present

## 2020-05-20 DIAGNOSIS — Z6833 Body mass index (BMI) 33.0-33.9, adult: Secondary | ICD-10-CM | POA: Diagnosis not present

## 2020-05-20 DIAGNOSIS — E669 Obesity, unspecified: Secondary | ICD-10-CM

## 2020-05-20 NOTE — Progress Notes (Signed)
Name: Pamela Nichols   MRN: [id number]    DOB: 1965/10/05   Date:05/20/2020       Progress Note  Subjective:    I connected with  Vale Haven  on 05/20/20 at  1:40 PM EST by a video enabled telemedicine application and verified that I am speaking with the correct person using two identifiers.  I discussed the limitations of evaluation and management by telemedicine and the availability of in person appointments. The patient expressed understanding and agreed to proceed. Staff also discussed with the patient that there may be a patient responsible charge related to this service. Patient Location: home Provider Location: [facility] Additional Individuals present: none  Chief Complaint  Patient presents with  . Follow-up  . Obesity    Pamela Nichols is a 55 y.o. female, presents for virtual visit for routine follow up on the conditions listed above.   Pt made appt to f/up on weight/obesity She gained about 20 lbs between 2019 and 2020 and weight has been fairly stable since then. She has used phentermine in the past for weight loss. Wt Readings from Last 10 Encounters:  02/12/20 180 lb 8 oz (81.9 kg)  01/08/20 182 lb 1.6 oz (82.6 kg)  12/04/19 181 lb 12.8 oz (82.5 kg)  10/03/19 181 lb 14.4 oz (82.5 kg)  12/06/18 183 lb (83 kg)  03/07/18 164 lb 8 oz (74.6 kg)  02/07/18 164 lb (74.4 kg)  01/26/18 167 lb 15.9 oz (76.2 kg)  01/20/18 168 lb (76.2 kg)  12/06/17 167 lb (75.8 kg)   BMI Readings from Last 5 Encounters:  02/12/20 33.01 kg/m  01/08/20 33.31 kg/m  12/04/19 33.25 kg/m  10/03/19 33.27 kg/m  12/06/18 33.47 kg/m   BP Readings from Last 3 Encounters:  02/12/20 122/74  12/04/19 128/78  10/03/19 132/84   Pulse Readings from Last 3 Encounters:  02/12/20 90  12/04/19 92  10/03/19 94   VS weights past med hx reviewed and verified today   Patient Active Problem List   Diagnosis Date Noted  . S/P hysterectomy 01/26/2018  . Constipation 07/10/2013   . Vitamin D deficiency 07/10/2013    Current Outpatient Medications:  .  acetaminophen (TYLENOL) 325 MG tablet, Take 650 mg by mouth every 6 (six) hours as needed., Disp: , Rfl:  .  cholecalciferol (VITAMIN D3) 25 MCG (1000 UNIT) tablet, Take 1,000 Units by mouth daily., Disp: , Rfl:  .  Ibuprofen 200 MG CAPS, Take by mouth., Disp: , Rfl:  .  meloxicam (MOBIC) 7.5 MG tablet, Take 1 tablet (7.5 mg total) by mouth daily as needed for pain., Disp: 30 tablet, Rfl: 1 No Known Allergies  Past Surgical History:  Procedure Laterality Date  . ABDOMINAL HYSTERECTOMY    . SALPINGOOPHORECTOMY Bilateral 01/26/2018   Procedure: BILATERAL SALPINGO OOPHORECTOMY;  Surgeon: Lazaro Arms, MD;  Location: [facility];  Service: Gynecology;  Laterality: Bilateral;  . SUPRACERVICAL ABDOMINAL HYSTERECTOMY N/A 01/26/2018   Procedure: HYSTERECTOMY SUPRACERVICAL ABDOMINAL;  Surgeon: Lazaro Arms, MD;  Location: [facility];  Service: Gynecology;  Laterality: N/A;  . TUBAL LIGATION     Family History  Problem Relation Age of Onset  . Hypertension Mother   . Mental illness Mother        Depression  . Cancer Father        prostate   . Hypertension Sister   . Hypertension Sister   . Hypertension Sister   . Breast cancer Neg Hx    Social History  Socioeconomic History  . Marital status: Married    Spouse name: Not on file  . Number of children: 2  . Years of education: 53  . Highest education level: Not on file  Occupational History  . Occupation: Hair Stylist  Tobacco Use  . Smoking status: Never Smoker  . Smokeless tobacco: Never Used  Vaping Use  . Vaping Use: Never used  Substance and Sexual Activity  . Alcohol use: No    Alcohol/week: 0.0 standard drinks  . Drug use: No  . Sexual activity: Yes    Birth control/protection: Surgical, Post-menopausal  Other Topics Concern  . Not on file  Social History Narrative   Krishana grew up in [place]. She is married and lives in [place] with  her husband. She works as a Probation officer. She and her husband are raising their nephew that is 46 years old. She has 2 adult children from a previous marriage. Her children live in [place]. She enjoys being outdoors. She also enjoys relaxing and watching TV.   Social Determinants of Health   Financial Resource Strain: Not on file  Food Insecurity: Not on file  Transportation Needs: Not on file  Physical Activity: Not on file  Stress: Not on file  Social Connections: Not on file  Intimate Partner Violence: Not on file    Chart Review Today: I personally reviewed active problem list, medication list, allergies, family history, social history, health maintenance, notes from last encounter, lab results, imaging with the patient/caregiver today.   Review of Systems  All other Systems reviewed and are negative for acute change except as noted in the HPI.   Objective:    Virtual encounter, vitals limited, only able to obtain the following There were no vitals filed for this visit. There is no height or weight on file to calculate BMI. Nursing Note and Vital Signs reviewed.  Physical Exam Alert oriented, pleasant, phonation clear, well appearing, NAD PE limited by virtual encounter  No results found for this or any previous visit (from the past 72 hour(s)).  PHQ2/9: Depression screen [facility] 2/9 05/20/2020 02/12/2020 01/08/2020 12/04/2019 10/03/2019  Decreased Interest 1 0 0 0 0  Down, Depressed, Hopeless 1 0 0 0 1  PHQ - 2 Score 2 0 0 0 1  Altered sleeping 0 - - 0 0  Tired, decreased energy 2 - - 0 0  Change in appetite 1 - - 0 0  Feeling bad or failure about yourself  0 - - 0 0  Trouble concentrating 0 - - 0 0  Moving slowly or fidgety/restless 0 - - 0 0  Suicidal thoughts 0 - - 0 0  PHQ-9 Score 5 - - 0 1  Difficult doing work/chores Not difficult at all - - Not difficult at all Not difficult at all   PHQ-2/9 Result is positive, reviewed with patient today  Fall Risk: Fall Risk   05/20/2020 02/12/2020 01/08/2020 12/04/2019 10/03/2019  Falls in the past year? 0 0 0 0 0  Number falls in past yr: 0 0 - 0 0  Injury with Fall? 0 0 - 0 0  Follow up - Falls evaluation completed - - -     Assessment and Plan:     ICD-10-CM   1. Class 1 obesity without serious comorbidity with body mass index (BMI) of 33.0 to 33.9 in adult, unspecified obesity type  E66.9    Z68.33   2. Encounter for weight management  Z76.89    Work on  structured diet/nutrition efforts, document, and increase physical activity as much as possible - even if going to walks several times a week. Monitor both and bring in with her for next f/up May try phentermine again for short term use - risks and SE reviewed with the pt If not effective for weight loss, BP or VS become unstable, or if she does not do f/up or show efforts with diet/lifestyle changes - then she understands that I may not refill the controlled substances again since the SE and risks will be greater than the benefit  1 month f/up in office Referrals to medical weight management and/or nutritionist/dietician offered today  I discussed the assessment and treatment plan with the patient. The patient was provided an opportunity to ask questions and all were answered. The patient agreed with the plan and demonstrated an understanding of the instructions.  The patient was advised to call back or seek an in-person evaluation if the symptoms worsen or if the condition fails to improve as anticipated.  I provided 20+ minutes of non-face-to-face time during this encounter.  Delsa Grana, PA-C 05/20/20 12:47 PM

## 2020-05-31 ENCOUNTER — Telehealth (INDEPENDENT_AMBULATORY_CARE_PROVIDER_SITE_OTHER): Payer: BC Managed Care – PPO | Admitting: Internal Medicine

## 2020-05-31 DIAGNOSIS — U071 COVID-19: Secondary | ICD-10-CM

## 2020-05-31 NOTE — Progress Notes (Signed)
Name: Pamela Nichols   MRN: [id number]    DOB: 11-02-1965   Date:05/31/2020       Progress Note  Subjective  Chief Complaint  Chief Complaint  Patient presents with  . Covid Positive    I connected with  [name] on 05/31/20 at  9:20 AM EST by telephone and verified that I am speaking with the correct person using two identifiers.  I discussed the limitations, risks, security and privacy concerns of performing an evaluation and management service by telephone and the availability of in person appointments. The patient expressed understanding and agreed to proceed. Staff also discussed with the patient that there may be a patient responsible charge related to this service. Patient Location: Home Provider Location: [facility] Additional Individuals present: none  HPI  Patient is a 55 year old female patient of Delsa Grana Last visit with her was 05/20/2020 Presents today with a phone visit with the above complaint  COVID immunization status - had vaccine X2 and booster  Patient with Covid, test result was positive the Wed before last with home test and was confirmed with test in [place] Sx's started on the Wed when tested Lasted only about 2 days and then improved, denies sx's presently   Notes symptoms presently include No marked cough,  No marked SOB No fever, feeling feverish No mild sore throat and congestion No loss of smell, loss of taste No N/V No muscle aches (did last week for a couple days) No marked loose stools/diarrhea No CP, passing out episodes Has been taking Dayquil, mucinex, theraflu this am.   Tobacco-never smoker Comorbid conditions reviewed  No h/o asthma/COPD No h/o DM, heart disease,  + obesity  Works as Probation officer  Has been out for over a week. Wears mask at work all the time  Patient Active Problem List   Diagnosis Date Noted  . S/P hysterectomy 01/26/2018  . Constipation 07/10/2013  . Vitamin D deficiency 07/10/2013     Past Surgical History:  Procedure Laterality Date  . ABDOMINAL HYSTERECTOMY    . SALPINGOOPHORECTOMY Bilateral 01/26/2018   Procedure: BILATERAL SALPINGO OOPHORECTOMY;  Surgeon: Florian Buff, MD;  Location: [facility];  Service: Gynecology;  Laterality: Bilateral;  . SUPRACERVICAL ABDOMINAL HYSTERECTOMY N/A 01/26/2018   Procedure: HYSTERECTOMY SUPRACERVICAL ABDOMINAL;  Surgeon: Florian Buff, MD;  Location: [facility];  Service: Gynecology;  Laterality: N/A;  . TUBAL LIGATION      Family History  Problem Relation Age of Onset  . Hypertension Mother   . Mental illness Mother        Depression  . Cancer Father        prostate   . Hypertension Sister   . Hypertension Sister   . Hypertension Sister   . Breast cancer Neg Hx     Social History   Tobacco Use  . Smoking status: Never Smoker  . Smokeless tobacco: Never Used  Substance Use Topics  . Alcohol use: No    Alcohol/week: 0.0 standard drinks     Current Outpatient Medications:  .  acetaminophen (TYLENOL) 325 MG tablet, Take 650 mg by mouth every 6 (six) hours as needed., Disp: , Rfl:  .  cholecalciferol (VITAMIN D3) 25 MCG (1000 UNIT) tablet, Take 1,000 Units by mouth daily., Disp: , Rfl:  .  Ibuprofen 200 MG CAPS, Take by mouth., Disp: , Rfl:  .  meloxicam (MOBIC) 7.5 MG tablet, Take 1 tablet (7.5 mg total) by mouth daily as needed for pain., Disp: 30 tablet,  Rfl: 1  No Known Allergies  With staff assistance, above reviewed with the patient today.  ROS: As per HPI, otherwise no specific complaints on a limited and focused system review   Objective  Virtual encounter, vitals not obtained.  There is no height or weight on file to calculate BMI.  Physical Exam   Appears in NAD via conversation, pleasant Pulmonary/Chest: No obvious respiratory distress. Speaking in complete sentences Neurological: Pt is alert, Speech is normal Psychiatric: Patient has a normal mood and affect, behavior is normal. Judgment and  thought content normal.   No results found for this or any previous visit (from the past 72 hour(s)).  PHQ2/9: Depression screen [facility] 2/9 05/31/2020 05/20/2020 02/12/2020 01/08/2020 12/04/2019  Decreased Interest 0 1 0 0 0  Down, Depressed, Hopeless 0 1 0 0 0  PHQ - 2 Score 0 2 0 0 0  Altered sleeping 0 0 - - 0  Tired, decreased energy 0 2 - - 0  Change in appetite 0 1 - - 0  Feeling bad or failure about yourself  0 0 - - 0  Trouble concentrating 0 0 - - 0  Moving slowly or fidgety/restless 0 0 - - 0  Suicidal thoughts 0 0 - - 0  PHQ-9 Score 0 5 - - 0  Difficult doing work/chores - Not difficult at all - - Not difficult at all   PHQ-2/9 Result reviewed  Fall Risk: Fall Risk  05/31/2020 05/20/2020 02/12/2020 01/08/2020 12/04/2019  Falls in the past year? 0 0 0 0 0  Number falls in past yr: 0 0 0 - 0  Injury with Fall? 0 0 0 - 0  Follow up - - Falls evaluation completed - -     Assessment & Plan 1. COVID Patient was symptomatic about 12 days ago, had a positive test then and she noted her symptoms lasted about 2 days, and significantly improved.  She noted she even wondered if it was a false positive.  She denied any current symptoms of concern when asked, and has been feeling well this week.  She noted wanting to get back to work, and wanted to know if there was anything more she needed to do before returning. I noted to her the current recommendations for return to work, and do feel she is able to return presently.  Did recommend wearing a mask when she returns at least for the next week, and she states she wears a mask all the time at work regardless. Also noted she does not need to be taking any further medicines like DayQuil or TheraFlu's if she is not having symptoms.  If symptoms again return, follow-up is recommended.  I discussed the assessment and treatment plan with the patient. The patient was provided an opportunity to ask questions and all were answered. The patient agreed with  the plan and demonstrated an understanding   The patient was advised to call back or seek an in-person evaluation if the symptoms worsen or if the condition fails to improve as anticipated.  I provided 12 minutes of non-face-to-face time during this encounter that included discussing at length patient's sx/history, pertinent pmhx, medications, treatment and follow up plan. This time also included the necessary documentation, orders, and chart review.  Towanda Malkin, MD

## 2020-06-24 ENCOUNTER — Other Ambulatory Visit: Payer: Self-pay

## 2020-06-24 ENCOUNTER — Telehealth (INDEPENDENT_AMBULATORY_CARE_PROVIDER_SITE_OTHER): Payer: BC Managed Care – PPO | Admitting: Family Medicine

## 2020-06-24 ENCOUNTER — Encounter: Payer: Self-pay | Admitting: Family Medicine

## 2020-06-24 DIAGNOSIS — B379 Candidiasis, unspecified: Secondary | ICD-10-CM | POA: Diagnosis not present

## 2020-06-24 DIAGNOSIS — T3695XA Adverse effect of unspecified systemic antibiotic, initial encounter: Secondary | ICD-10-CM | POA: Diagnosis not present

## 2020-06-24 MED ORDER — FLUCONAZOLE 150 MG PO TABS: 150.0000 mg | ORAL_TABLET | ORAL | 0 refills | Status: DC | PRN

## 2020-06-24 NOTE — Progress Notes (Signed)
Name: Pamela Nichols   MRN: [id number]    DOB: May 30, 1965   Date:06/24/2020       Progress Note  Subjective:    Chief Complaint  Chief Complaint  Patient presents with   Consult    Was seen at dentist and was given an antibiotic now have a yeast infection     I connected with  [name] on 06/24/20 at 11:20 AM EST by telephone and verified that I am speaking with the correct person using two identifiers.   I discussed the limitations, risks, security and privacy concerns of performing an evaluation and management service by telephone and the availability of in person appointments. Staff also discussed with the patient that there may be a patient responsible charge related to this service.  Patient verbalized understanding and agreed to proceed with encounter. Patient Location:  home Provider Location: [facility] clinic  Additional Individuals present: none  HPI  Pt was treated with amoxicillin starting last week for tooth infection- she is concerned with secondary yeast infection.    Patient Active Problem List   Diagnosis Date Noted   S/P hysterectomy 01/26/2018   Constipation 07/10/2013   Vitamin D deficiency 07/10/2013    Social History   Tobacco Use   Smoking status: Never Smoker   Smokeless tobacco: Never Used  Substance Use Topics   Alcohol use: No    Alcohol/week: 0.0 standard drinks     Current Outpatient Medications:    acetaminophen (TYLENOL) 325 MG tablet, Take 650 mg by mouth every 6 (six) hours as needed., Disp: , Rfl:    cholecalciferol (VITAMIN D3) 25 MCG (1000 UNIT) tablet, Take 1,000 Units by mouth daily., Disp: , Rfl:    Ibuprofen 200 MG CAPS, Take by mouth., Disp: , Rfl:    meloxicam (MOBIC) 7.5 MG tablet, Take 1 tablet (7.5 mg total) by mouth daily as needed for pain., Disp: 30 tablet, Rfl: 1  No Known Allergies  Chart Review: I personally reviewed active problem list, medication list, allergies, family history, social history, health  maintenance, notes from last encounter, lab results, imaging with the patient/caregiver today.   Review of Systems 10 Systems reviewed and are negative for acute change except as noted in the HPI.   Objective:    Virtual encounter, vitals limited, only able to obtain the following There were no vitals filed for this visit. There is no height or weight on file to calculate BMI. Nursing Note and Vital Signs reviewed.  Physical Exam Phonation clear, pt alert, NAD PE limited by virtual  encounter  No results found for this or any previous visit (from the past 72 hour(s)).  Assessment and Plan:     ICD-10-CM   1. Antibiotic-induced yeast infection  B37.9 fluconazole (DIFLUCAN) 150 MG tablet   T36.95XA     -Red flags and when to present for emergency care or RTC including but not limited to new/worsening/un-resolving symptoms,  reviewed with patient at time of visit. Follow up and care instructions discussed and provided in AVS. - I discussed the assessment and treatment plan with the patient. The patient was provided an opportunity to ask questions and all were answered. The patient agreed with the plan and demonstrated an understanding of the instructions.  - The patient was advised to call back or seek an in-person evaluation if the symptoms worsen or if the condition fails to improve as anticipated.  I provided 15+ minutes of non-face-to-face time during this encounter.  Delsa Grana, PA-C 06/24/20 11:50  AM

## 2021-02-19 ENCOUNTER — Ambulatory Visit: Payer: Self-pay | Admitting: *Deleted

## 2021-02-19 NOTE — Telephone Encounter (Signed)
Pt called in c/o feeling lightheaded for over a month.  See triage notes.  Delsa Grana is out on FMLA so pt has decided she is going to the urgent care instead.    I sent my notes to [facility].

## 2021-02-19 NOTE — Telephone Encounter (Signed)
Reason for Disposition  [1] MODERATE dizziness (e.g., interferes with normal activities) AND [2] has NOT been evaluated by physician for this  (Exception: dizziness caused by heat exposure, sudden standing, or poor fluid intake)  Answer Assessment - Initial Assessment Questions 1. DESCRIPTION: "Describe your dizziness."     Pt. called in because she wants an appt.   She doesn't feel good for the last month.   She just feels like it's up.   It could be my diet.   2. LIGHTHEADED: "Do you feel lightheaded?" (e.g., somewhat faint, woozy, weak upon standing)     I feel lightheaded and off balance all the time.  Going on at least a month.   I've been trying to fight through it.   I'm not on medication.  Sometimes I feel like I'm going to fall when I'm standing in one spot.  3. VERTIGO: "Do you feel like either you or the room is spinning or tilting?" (i.e. vertigo)     No 4. SEVERITY: "How bad is it?"  "Do you feel like you are going to faint?" "Can you stand and walk?"   - MILD: Feels slightly dizzy, but walking normally.   - MODERATE: Feels unsteady when walking, but not falling; interferes with normal activities (e.g., school, work).   - SEVERE: Unable to walk without falling, or requires assistance to walk without falling; feels like passing out now.      Sometimes I find myself holding a chair to keep from falling. 5. ONSET:  "When did the dizziness begin?"     At least a month ago. I feel like it's hard for me to breath sometimes when I'm feeling lightheaded.  No heart problems.   No lung problems. 6. AGGRAVATING FACTORS: "Does anything make it worse?" (e.g., standing, change in head position)     Standing in one place makes it worse.   Sometimes when [place] I feel anxiety. 7. HEART RATE: "Can you tell me your heart rate?" "How many beats in 15 seconds?"  (Note: not all patients can do this)       Not asked 8. CAUSE: "What do you think is causing the dizziness?"      Maybe my diet.   I go a long time without eating sometimes.   9. RECURRENT SYMPTOM: "Have you had dizziness before?" If Yes, ask: "When was the last time?" "What happened that time?"     No 10. OTHER SYMPTOMS: "Do you have any other symptoms?" (e.g., fever, chest pain, vomiting, diarrhea, bleeding)       Having shortness of breath when feeling lightheaded.   I break out in a sweat when I'm lightheaded. 11. PREGNANCY: "Is there any chance you are pregnant?" "When was your last menstrual period?"       N/A due to age  Protocols used: Dizziness - Lightheadedness-A-AH

## 2021-02-24 ENCOUNTER — Other Ambulatory Visit: Payer: Self-pay

## 2021-02-24 ENCOUNTER — Encounter: Payer: Self-pay | Admitting: Nurse Practitioner

## 2021-02-24 ENCOUNTER — Ambulatory Visit: Payer: BC Managed Care – PPO | Admitting: Nurse Practitioner

## 2021-02-24 VITALS — BP 128/74 | HR 90 | Temp 98.3°F | Resp 18 | Ht 62.0 in | Wt 176.4 lb

## 2021-02-24 DIAGNOSIS — Z1231 Encounter for screening mammogram for malignant neoplasm of breast: Secondary | ICD-10-CM

## 2021-02-24 DIAGNOSIS — N951 Menopausal and female climacteric states: Secondary | ICD-10-CM

## 2021-02-24 DIAGNOSIS — R42 Dizziness and giddiness: Secondary | ICD-10-CM

## 2021-02-24 MED ORDER — ESCITALOPRAM OXALATE 10 MG PO TABS: 10.0000 mg | ORAL_TABLET | Freq: Every day | ORAL | 0 refills | Status: DC

## 2021-02-24 NOTE — Progress Notes (Signed)
BP 128/74   Pulse 90   Temp 98.3 F (36.8 C) (Oral)   Resp 18   Ht 5\' 2"  (1.575 m)   Wt 176 lb 6.4 oz (80 kg)   LMP 04/02/2014   SpO2 97%   BMI 32.26 kg/m    Subjective:    Patient ID: Pamela Nichols, female    DOB: 1966/03/06, 55 y.o.   MRN: [id number]  HPI: Pamela Nichols is a 55 y.o. female, here alone  Chief Complaint  Patient presents with   Hot Flashes   Dizziness   Dizziness:  She says she has had dizziness for about a month.  It comes and goes.  She says it can happen any time of day.  She says that it does not happen when changing position.  She says that sometimes she has to catch herself from falling.  She denies any numbness or tingling down extremities.  She denies any slurred speech. She denies any headache, nausea or vomiting.  She thinks it might be because she has not been eating.  Discussed eating habits and she says that she just forgets to eat, like today.  She is going to eat better and keep a dizziness journal to see if that helps.  We will get labs.  If labs are negative and eating more consistently does not help, will get MRI of head.   Hot Flashes:  She says she has had really bad hot flashes for a few months.  She says it can happen during the day or at night, but it mostly happens in the morning.  She would like to take something for it.  Will start patient on Lexapro.    Relevant past medical, surgical, family and social history reviewed and updated as indicated. Interim medical history since our last visit reviewed. Allergies and medications reviewed and updated.  Review of Systems  Constitutional: Negative for fever or weight change.  Respiratory: Negative for cough and shortness of breath.   Cardiovascular: Negative for chest pain or palpitations.  Gastrointestinal: Negative for abdominal pain, no bowel changes.  Musculoskeletal: Negative for gait problem or joint swelling.  Skin: Negative for rash.  Neurological: Positive for  dizziness, negative for headache.  No other specific complaints in a complete review of systems (except as listed in HPI above).      Objective:    BP 128/74   Pulse 90   Temp 98.3 F (36.8 C) (Oral)   Resp 18   Ht 5\' 2"  (1.575 m)   Wt 176 lb 6.4 oz (80 kg)   LMP 04/02/2014   SpO2 97%   BMI 32.26 kg/m   Wt Readings from Last 3 Encounters:  02/24/21 176 lb 6.4 oz (80 kg)  02/12/20 180 lb 8 oz (81.9 kg)  01/08/20 182 lb 1.6 oz (82.6 kg)    Physical Exam  Constitutional: Patient appears well-developed and well-nourished. Obese No distress.  HEENT: head atraumatic, normocephalic, pupils equal and reactive to light, no nystagmus, ears TMs clear, neck supple, throat within normal limits Cardiovascular: Normal rate, regular rhythm and normal heart sounds.  No murmur heard. No BLE edema. Pulmonary/Chest: Effort normal and breath sounds normal. No respiratory distress. Abdominal: Soft.  There is no tenderness. Psychiatric: Patient has a normal mood and affect. behavior is normal. Judgment and thought content normal.  Neuro: equal grips, negative Romberg test  Results for orders placed or performed in visit on 02/12/20  Lipid panel  Result Value Ref Range   Cholesterol  199 <200 mg/dL   HDL 80 > OR = 50 mg/dL   Triglycerides 95 <150 mg/dL   LDL Cholesterol (Calc) 100 (H) mg/dL (calc)   Total CHOL/HDL Ratio 2.5 <5.0 (calc)   Non-HDL Cholesterol (Calc) 119 <130 mg/dL (calc)  COMPLETE METABOLIC PANEL WITH GFR  Result Value Ref Range   Glucose, Bld 85 65 - 99 mg/dL   BUN 8 7 - 25 mg/dL   Creat 0.62 0.50 - 1.05 mg/dL   GFR, Est Non African American 101 > OR = 60 mL/min/1.76m2   GFR, Est African American 117 > OR = 60 mL/min/1.31m2   BUN/Creatinine Ratio NOT APPLICABLE 6 - 22 (calc)   Sodium 141 135 - 146 mmol/L   Potassium 4.1 3.5 - 5.3 mmol/L   Chloride 107 98 - 110 mmol/L   CO2 27 20 - 32 mmol/L   Calcium 9.3 8.6 - 10.4 mg/dL   Total Protein 6.9 6.1 - 8.1 g/dL   Albumin 4.1  3.6 - 5.1 g/dL   Globulin 2.8 1.9 - 3.7 g/dL (calc)   AG Ratio 1.5 1.0 - 2.5 (calc)   Total Bilirubin 0.6 0.2 - 1.2 mg/dL   Alkaline phosphatase (APISO) 85 37 - 153 U/L   AST 13 10 - 35 U/L   ALT 11 6 - 29 U/L  CBC w/Diff/Platelet  Result Value Ref Range   WBC 3.9 3.8 - 10.8 Thousand/uL   RBC 4.47 3.80 - 5.10 Million/uL   Hemoglobin 12.6 11.7 - 15.5 g/dL   HCT 39.6 35.0 - 45.0 %   MCV 88.6 80.0 - 100.0 fL   MCH 28.2 27.0 - 33.0 pg   MCHC 31.8 (L) 32.0 - 36.0 g/dL   RDW 13.3 11.0 - 15.0 %   Platelets 197 140 - 400 Thousand/uL   MPV 10.6 7.5 - 12.5 fL   Neutro Abs 1,447 (L) 1,500 - 7,800 cells/uL   Lymphs Abs 2,024 850 - 3,900 cells/uL   Absolute Monocytes 269 200 - 950 cells/uL   Eosinophils Absolute 121 15 - 500 cells/uL   Basophils Absolute 39 0 - 200 cells/uL   Neutrophils Relative % 37.1 %   Total Lymphocyte 51.9 %   Monocytes Relative 6.9 %   Eosinophils Relative 3.1 %   Basophils Relative 1.0 %      Assessment & Plan:   1. Dizziness  - CBC with Differential/Platelet - COMPLETE METABOLIC PANEL WITH GFR - TSH  2. Hot flashes due to menopause  - escitalopram (LEXAPRO) 10 MG tablet; Take 1 tablet (10 mg total) by mouth daily.  Dispense: 30 tablet; Refill: 0  3. Screening mammogram for breast cancer  - MM 3D SCREEN BREAST BILATERAL; Future     Follow up plan: Return in about 3 months (around 05/27/2021) for cpe.

## 2021-02-28 ENCOUNTER — Other Ambulatory Visit: Payer: Self-pay | Admitting: Nurse Practitioner

## 2021-02-28 DIAGNOSIS — R7989 Other specified abnormal findings of blood chemistry: Secondary | ICD-10-CM

## 2021-03-01 LAB — T4, FREE: Free T4: 1.1 ng/dL (ref 0.8–1.8)

## 2021-03-01 LAB — COMPLETE METABOLIC PANEL WITH GFR
AG Ratio: 1.8 (calc) (ref 1.0–2.5)
ALT: 10 U/L (ref 6–29)
AST: 12 U/L (ref 10–35)
Albumin: 4.3 g/dL (ref 3.6–5.1)
Alkaline phosphatase (APISO): 79 U/L (ref 37–153)
BUN: 8 mg/dL (ref 7–25)
CO2: 31 mmol/L (ref 20–32)
Calcium: 9.1 mg/dL (ref 8.6–10.4)
Chloride: 103 mmol/L (ref 98–110)
Creat: 0.67 mg/dL (ref 0.50–1.03)
Globulin: 2.4 g/dL (calc) (ref 1.9–3.7)
Glucose, Bld: 61 mg/dL — ABNORMAL LOW (ref 65–99)
Potassium: 3.6 mmol/L (ref 3.5–5.3)
Sodium: 142 mmol/L (ref 135–146)
Total Bilirubin: 0.5 mg/dL (ref 0.2–1.2)
Total Protein: 6.7 g/dL (ref 6.1–8.1)
eGFR: 102 mL/min/{1.73_m2} (ref >=60)

## 2021-03-01 LAB — CBC WITH DIFFERENTIAL/PLATELET
Absolute Monocytes: 414 cells/uL (ref 200–950)
Basophils Absolute: 40 cells/uL (ref 0–200)
Basophils Relative: 0.9 %
Eosinophils Absolute: 92 cells/uL (ref 15–500)
Eosinophils Relative: 2.1 %
HCT: 37.1 % (ref 35.0–45.0)
Hemoglobin: 12.2 g/dL (ref 11.7–15.5)
Lymphs Abs: 2306 cells/uL (ref 850–3900)
MCH: 28.9 pg (ref 27.0–33.0)
MCHC: 32.9 g/dL (ref 32.0–36.0)
MCV: 87.9 fL (ref 80.0–100.0)
MPV: 10.6 fL (ref 7.5–12.5)
Monocytes Relative: 9.4 %
Neutro Abs: 1549 cells/uL (ref 1500–7800)
Neutrophils Relative %: 35.2 %
Platelets: 198 10*3/uL (ref 140–400)
RBC: 4.22 10*6/uL (ref 3.80–5.10)
RDW: 13 % (ref 11.0–15.0)
Total Lymphocyte: 52.4 %
WBC: 4.4 10*3/uL (ref 3.8–10.8)

## 2021-03-01 LAB — T3, FREE: T3, Free: 3.1 pg/mL (ref 2.3–4.2)

## 2021-03-01 LAB — TEST AUTHORIZATION

## 2021-03-01 LAB — TSH: TSH: 0.27 mIU/L — ABNORMAL LOW (ref 0.40–4.50)

## 2021-03-03 ENCOUNTER — Other Ambulatory Visit: Payer: Self-pay | Admitting: Nurse Practitioner

## 2021-03-03 DIAGNOSIS — R7989 Other specified abnormal findings of blood chemistry: Secondary | ICD-10-CM

## 2021-03-20 ENCOUNTER — Other Ambulatory Visit: Payer: Self-pay | Admitting: Nurse Practitioner

## 2021-03-20 DIAGNOSIS — N951 Menopausal and female climacteric states: Secondary | ICD-10-CM

## 2021-03-20 NOTE — Telephone Encounter (Signed)
Last appt was 02/24/21 with Dr Ky Barban

## 2021-03-20 NOTE — Telephone Encounter (Signed)
Requested medication (s) are due for refill today: No  Requested medication (s) are on the active medication list: Yes  Last refill:  02/24/21  Future visit scheduled: No  Notes to clinic:  Pharmacy requesting 90 day supply.    Requested Prescriptions  Pending Prescriptions Disp Refills   escitalopram (LEXAPRO) 10 MG tablet [Pharmacy Med Name: ESCITALOPRAM 10 MG TABLET] 90 tablet 1    Sig: TAKE 1 TABLET BY MOUTH EVERY DAY     Psychiatry:  Antidepressants - SSRI Passed - 03/20/2021  9:32 AM      Passed - Valid encounter within last 6 months    Recent Outpatient Visits           3 weeks ago [facility] Bo Merino, FNP   8 months ago Antibiotic-induced yeast infection   [facility] Delsa Grana, PA-C   9 months ago [facility] Lebron Conners D, MD   10 months ago Class 1 obesity without serious comorbidity with body mass index (BMI) of 33.0 to 33.9 in adult, unspecified obesity type   [facility] Delsa Grana, PA-C   1 year ago Annual physical exam   [facility] Delsa Grana, [place]

## 2021-07-14 ENCOUNTER — Ambulatory Visit (INDEPENDENT_AMBULATORY_CARE_PROVIDER_SITE_OTHER): Payer: PRIVATE HEALTH INSURANCE | Admitting: Nurse Practitioner

## 2021-07-14 ENCOUNTER — Other Ambulatory Visit: Payer: Self-pay

## 2021-07-14 ENCOUNTER — Encounter: Payer: Self-pay | Admitting: Nurse Practitioner

## 2021-07-14 VITALS — BP 124/72 | HR 82 | Temp 98.2°F | Resp 16 | Ht 62.0 in | Wt 180.6 lb

## 2021-07-14 DIAGNOSIS — E0590 Thyrotoxicosis, unspecified without thyrotoxic crisis or storm: Secondary | ICD-10-CM | POA: Diagnosis not present

## 2021-07-14 NOTE — Progress Notes (Addendum)
BP 124/72    Pulse 82    Temp 98.2 F (36.8 C) (Oral)    Resp 16    Ht 5' 2"  (1.575 m)    Wt 180 lb 9.6 oz (81.9 kg)    LMP 04/02/2014    SpO2 99%    BMI 33.03 kg/m    Subjective:    Patient ID: Pamela Nichols, female    DOB: 16-Oct-1965, 56 y.o.   MRN: [id number]  HPI: Pamela Nichols is a 56 y.o. female  Chief Complaint  Patient presents with   Follow-up    Thyroid    Subclinical hyperthyroidism: Checked her TSH on 02/27/2021 it was 0.27, T3 and T4 were normal.  Placed referral to endocrinology and was unable to be seen.  Will place new referral to another office.  Will also recheck thyroid levels. She denies any palpitations, constipation, changes in skin or heat and cold intolerance. She says the only thing she has is some fatigue.  Mammogram: She has not gotten her mammogram done yet. Gave her breast center card to call and make appointment, order is still good.   Relevant past medical, surgical, family and social history reviewed and updated as indicated. Interim medical history since our last visit reviewed. Allergies and medications reviewed and updated.  Review of Systems  Constitutional: Negative for fever or weight change.  Respiratory: Negative for cough and shortness of breath.   Cardiovascular: Negative for chest pain or palpitations.  Gastrointestinal: Negative for abdominal pain, no bowel changes.  Musculoskeletal: Negative for gait problem or joint swelling.  Skin: Negative for rash.  Neurological: Negative for dizziness or headache.  No other specific complaints in a complete review of systems (except as listed in HPI above).      Objective:    BP 124/72    Pulse 82    Temp 98.2 F (36.8 C) (Oral)    Resp 16    Ht 5' 2"  (1.575 m)    Wt 180 lb 9.6 oz (81.9 kg)    LMP 04/02/2014    SpO2 99%    BMI 33.03 kg/m   Wt Readings from Last 3 Encounters:  07/14/21 180 lb 9.6 oz (81.9 kg)  02/24/21 176 lb 6.4 oz (80 kg)  02/12/20 180 lb 8 oz (81.9 kg)     Physical Exam  Constitutional: Patient appears well-developed and well-nourished. Obese  No distress.  HEENT: head atraumatic, normocephalic, pupils equal and reactive to light, neck supple Cardiovascular: Normal rate, regular rhythm and normal heart sounds.  No murmur heard. No BLE edema. Pulmonary/Chest: Effort normal and breath sounds normal. No respiratory distress. Abdominal: Soft.  There is no tenderness. Psychiatric: Patient has a normal mood and affect. behavior is normal. Judgment and thought content normal.   Results for orders placed or performed in visit on 02/24/21  CBC with Differential/Platelet  Result Value Ref Range   WBC 4.4 3.8 - 10.8 Thousand/uL   RBC 4.22 3.80 - 5.10 Million/uL   Hemoglobin 12.2 11.7 - 15.5 g/dL   HCT 37.1 35.0 - 45.0 %   MCV 87.9 80.0 - 100.0 fL   MCH 28.9 27.0 - 33.0 pg   MCHC 32.9 32.0 - 36.0 g/dL   RDW 13.0 11.0 - 15.0 %   Platelets 198 140 - 400 Thousand/uL   MPV 10.6 7.5 - 12.5 fL   Neutro Abs 1,549 1,500 - 7,800 cells/uL   Lymphs Abs 2,306 850 - 3,900 cells/uL   Absolute Monocytes 414 200 - 950  cells/uL   Eosinophils Absolute 92 15 - 500 cells/uL   Basophils Absolute 40 0 - 200 cells/uL   Neutrophils Relative % 35.2 %   Total Lymphocyte 52.4 %   Monocytes Relative 9.4 %   Eosinophils Relative 2.1 %   Basophils Relative 0.9 %  COMPLETE METABOLIC PANEL WITH GFR  Result Value Ref Range   Glucose, Bld 61 (L) 65 - 99 mg/dL   BUN 8 7 - 25 mg/dL   Creat 0.67 0.50 - 1.03 mg/dL   eGFR 102 > OR = 60 mL/min/1.59m   BUN/Creatinine Ratio NOT APPLICABLE 6 - 22 (calc)   Sodium 142 135 - 146 mmol/L   Potassium 3.6 3.5 - 5.3 mmol/L   Chloride 103 98 - 110 mmol/L   CO2 31 20 - 32 mmol/L   Calcium 9.1 8.6 - 10.4 mg/dL   Total Protein 6.7 6.1 - 8.1 g/dL   Albumin 4.3 3.6 - 5.1 g/dL   Globulin 2.4 1.9 - 3.7 g/dL (calc)   AG Ratio 1.8 1.0 - 2.5 (calc)   Total Bilirubin 0.5 0.2 - 1.2 mg/dL   Alkaline phosphatase (APISO) 79 37 - 153 U/L   AST  12 10 - 35 U/L   ALT 10 6 - 29 U/L  TSH  Result Value Ref Range   TSH 0.27 (L) 0.40 - 4.50 mIU/L  T4, free  Result Value Ref Range   Free T4 1.1 0.8 - 1.8 ng/dL  T3, free  Result Value Ref Range   T3, Free 3.1 2.3 - 4.2 pg/mL  TEST AUTHORIZATION  Result Value Ref Range   TEST NAME: T3, FREE T4, FREE    TEST CODE: [id number]    CLIENT CONTACT: LESLIE SMITH    REPORT ALWAYS MESSAGE SIGNATURE        Assessment & Plan:   1. Subclinical hyperthyroidism  - Thyroid Panel With TSH - Ambulatory referral to Endocrinology   Follow up plan: Return in about 6 months (around 01/11/2022) for follow up.

## 2021-07-15 LAB — THYROID PANEL WITH TSH
Free Thyroxine Index: 2.3 (ref 1.4–3.8)
T3 Uptake: 31 % (ref 22–35)
T4, Total: 7.3 ug/dL (ref 5.1–11.9)
TSH: 0.54 mIU/L (ref 0.40–4.50)

## 2022-01-12 ENCOUNTER — Ambulatory Visit (INDEPENDENT_AMBULATORY_CARE_PROVIDER_SITE_OTHER): Payer: 59 | Admitting: Nurse Practitioner

## 2022-01-12 ENCOUNTER — Other Ambulatory Visit: Payer: Self-pay

## 2022-01-12 ENCOUNTER — Encounter: Payer: Self-pay | Admitting: Nurse Practitioner

## 2022-01-12 VITALS — BP 118/70 | Temp 97.5°F | Resp 16 | Ht 62.0 in | Wt 176.3 lb

## 2022-01-12 DIAGNOSIS — E669 Obesity, unspecified: Secondary | ICD-10-CM | POA: Diagnosis not present

## 2022-01-12 DIAGNOSIS — Z130 Encounter for screening for diseases of the blood and blood-forming organs and certain disorders involving the immune mechanism: Secondary | ICD-10-CM | POA: Diagnosis not present

## 2022-01-12 DIAGNOSIS — Z6833 Body mass index (BMI) 33.0-33.9, adult: Secondary | ICD-10-CM | POA: Diagnosis not present

## 2022-01-12 DIAGNOSIS — Z131 Encounter for screening for diabetes mellitus: Secondary | ICD-10-CM | POA: Diagnosis not present

## 2022-01-12 DIAGNOSIS — E559 Vitamin D deficiency, unspecified: Secondary | ICD-10-CM | POA: Diagnosis not present

## 2022-01-12 DIAGNOSIS — E785 Hyperlipidemia, unspecified: Secondary | ICD-10-CM

## 2022-01-12 DIAGNOSIS — E0590 Thyrotoxicosis, unspecified without thyrotoxic crisis or storm: Secondary | ICD-10-CM | POA: Insufficient documentation

## 2022-01-12 DIAGNOSIS — R7989 Other specified abnormal findings of blood chemistry: Secondary | ICD-10-CM

## 2022-01-12 NOTE — Progress Notes (Signed)
BP 118/70   Temp (!) 97.5 F (36.4 C) (Oral)   Resp 16   Ht '5\' 2"'$  (1.575 m)   Wt 176 lb 4.8 oz (80 kg)   LMP 04/02/2014   SpO2 99%   BMI 32.25 kg/m    Subjective:    Patient ID: Pamela Nichols, female    DOB: [birth year], 56 y.o.   MRN: [id number]  HPI: Pamela Nichols is a 56 y.o. female  Chief Complaint  Patient presents with   Follow-up    6 month recheck   Subclinical hyperthyroidism/history of abnormal TSH: Last TSH was 0.54 on 07/14/2021.  Prior to that her TSH was 0.27 on 02/27/2021.  Denies any palpitations, changes in skin or heat and cold intolerance.  Obesity: She is going to the gym two days a week.  She is also walking in the evening. She is working on eating healthier.  Her weight today is 176 lbs with a BMI of 32.25.  Hyperlipidemia: Her last LDL was 100 on 02/12/2020.  She is not currently on cholesterol-lowering medication.  We will get labs today. The 10-year ASCVD risk score (Arnett DK, et al., 2019) is: 2.5%   Values used to calculate the score:     Age: 42 years     Sex: Female     Is Non-Hispanic African American: Yes     Diabetic: No     Tobacco smoker: No     Systolic Blood Pressure: 962 mmHg     Is BP treated: No     HDL Cholesterol: 80 mg/dL     Total Cholesterol: 199 mg/dL   Vitamin D deficiency: Her last vitamin D was 20 on 12/06/2018.  We will get labs today. She has not been taking vitamin D supplementation.      01/12/2022    1:33 PM 07/14/2021    2:03 PM 02/24/2021    1:09 PM 06/24/2020   11:07 AM 05/31/2020    8:59 AM  Depression screen PHQ 2/9  Decreased Interest 0 0 0 0 0  Down, Depressed, Hopeless 0 0 0 0 0  PHQ - 2 Score 0 0 0 0 0  Altered sleeping 1 0   0  Tired, decreased energy 1 0   0  Change in appetite 2 0   0  Feeling bad or failure about yourself  0 0   0  Trouble concentrating 0 0   0  Moving slowly or fidgety/restless 0 0   0  Suicidal thoughts 0 0   0  PHQ-9 Score 4 0   0  Difficult doing work/chores Not  difficult at all Not difficult at all       Relevant past medical, surgical, family and social history reviewed and updated as indicated. Interim medical history since our last visit reviewed. Allergies and medications reviewed and updated.  Review of Systems  Constitutional: Negative for fever or weight change.  Respiratory: Negative for cough and shortness of breath.   Cardiovascular: Negative for chest pain or palpitations.  Gastrointestinal: Negative for abdominal pain, no bowel changes.  Musculoskeletal: Negative for gait problem or joint swelling.  Skin: Negative for rash.  Neurological: Negative for dizziness or headache.  No other specific complaints in a complete review of systems (except as listed in HPI above).      Objective:    BP 118/70   Temp (!) 97.5 F (36.4 C) (Oral)   Resp 16   Ht '5\' 2"'$  (1.575 m)   Wt  176 lb 4.8 oz (80 kg)   LMP 04/02/2014   SpO2 99%   BMI 32.25 kg/m   Wt Readings from Last 3 Encounters:  01/12/22 176 lb 4.8 oz (80 kg)  07/14/21 180 lb 9.6 oz (81.9 kg)  02/24/21 176 lb 6.4 oz (80 kg)    Physical Exam  Constitutional: Patient appears well-developed and well-nourished. Obese  No distress.  HEENT: head atraumatic, normocephalic, pupils equal and reactive to light, neck supple Cardiovascular: Normal rate, regular rhythm and normal heart sounds.  No murmur heard. No BLE edema. Pulmonary/Chest: Effort normal and breath sounds normal. No respiratory distress. Abdominal: Soft.  There is no tenderness. Psychiatric: Patient has a normal mood and affect. behavior is normal. Judgment and thought content normal.   Results for orders placed or performed in visit on 07/14/21  Thyroid Panel With TSH  Result Value Ref Range   T3 Uptake 31 22 - 35 %   T4, Total 7.3 5.1 - 11.9 mcg/dL   Free Thyroxine Index 2.3 1.4 - 3.8   TSH 0.54 0.40 - 4.50 mIU/L      Assessment & Plan:   Problem List Items Addressed This Visit       Endocrine   Subclinical  hyperthyroidism - Primary    Her last TSH was 0.54 on 07/14/2021.  Prior to that her TSH was 0.27.  We will continue to monitor this.      Relevant Orders   TSH     Other   Vitamin D deficiency    She is not currently taking vitamin D supplementation.  Her last vitamin D was 20 on 12/06/2018.  We will recheck that today.      Relevant Orders   VITAMIN D 25 Hydroxy (Vit-D Deficiency, Fractures)   Class 1 obesity without serious comorbidity with body mass index (BMI) of 33.0 to 33.9 in adult    She states she started working out in the gym.  And is trying to eat more healthy.      History of abnormal TSH    Her last TSH was 0.54 on 07/14/2021.  Prior to that her TSH was 0.27.  We will continue to monitor this.      Relevant Orders   TSH   Hyperlipidemia    Patient's last LDL was 100 on 02/12/2020.  We will get labs today.      Relevant Orders   Lipid panel   Other Visit Diagnoses     Screening for deficiency anemia       Relevant Orders   CBC with Differential/Platelet   Screening for diabetes mellitus       Relevant Orders   COMPLETE METABOLIC PANEL WITH GFR   Hemoglobin A1c        Follow up plan: Return in about 6 months (around 07/15/2022) for follow up.

## 2022-01-12 NOTE — Assessment & Plan Note (Signed)
She states she started working out in [employer].  And is trying to eat more healthy.

## 2022-01-12 NOTE — Assessment & Plan Note (Signed)
Patient's last LDL was 100 on 02/12/2020.  We will get labs today.

## 2022-01-12 NOTE — Assessment & Plan Note (Signed)
Her last TSH was 0.54 on 07/14/2021.  Prior to that her TSH was 0.27.  We will continue to monitor this.

## 2022-01-12 NOTE — Assessment & Plan Note (Signed)
She is not currently taking vitamin D supplementation.  Her last vitamin D was 20 on 12/06/2018.  We will recheck that today.

## 2022-01-13 ENCOUNTER — Other Ambulatory Visit: Payer: Self-pay | Admitting: Nurse Practitioner

## 2022-01-13 DIAGNOSIS — E559 Vitamin D deficiency, unspecified: Secondary | ICD-10-CM

## 2022-01-13 LAB — CBC WITH DIFFERENTIAL/PLATELET
Absolute Monocytes: 353 cells/uL (ref 200–950)
Basophils Absolute: 19 cells/uL (ref 0–200)
Basophils Relative: 0.5 %
Eosinophils Absolute: 122 cells/uL (ref 15–500)
Eosinophils Relative: 3.2 %
HCT: 38.2 % (ref 35.0–45.0)
Hemoglobin: 12.3 g/dL (ref 11.7–15.5)
Lymphs Abs: 1790 cells/uL (ref 850–3900)
MCH: 28.3 pg (ref 27.0–33.0)
MCHC: 32.2 g/dL (ref 32.0–36.0)
MCV: 88 fL (ref 80.0–100.0)
MPV: 10.6 fL (ref 7.5–12.5)
Monocytes Relative: 9.3 %
Neutro Abs: 1516 cells/uL (ref 1500–7800)
Neutrophils Relative %: 39.9 %
Platelets: 199 10*3/uL (ref 140–400)
RBC: 4.34 10*6/uL (ref 3.80–5.10)
RDW: 13.2 % (ref 11.0–15.0)
Total Lymphocyte: 47.1 %
WBC: 3.8 10*3/uL (ref 3.8–10.8)

## 2022-01-13 LAB — COMPLETE METABOLIC PANEL WITH GFR
AG Ratio: 1.5 (calc) (ref 1.0–2.5)
ALT: 10 U/L (ref 6–29)
AST: 12 U/L (ref 10–35)
Albumin: 4.3 g/dL (ref 3.6–5.1)
Alkaline phosphatase (APISO): 79 U/L (ref 37–153)
BUN: 9 mg/dL (ref 7–25)
CO2: 28 mmol/L (ref 20–32)
Calcium: 9.6 mg/dL (ref 8.6–10.4)
Chloride: 107 mmol/L (ref 98–110)
Creat: 0.69 mg/dL (ref 0.50–1.03)
Globulin: 2.9 g/dL (calc) (ref 1.9–3.7)
Glucose, Bld: 77 mg/dL (ref 65–99)
Potassium: 3.9 mmol/L (ref 3.5–5.3)
Sodium: 144 mmol/L (ref 135–146)
Total Bilirubin: 0.4 mg/dL (ref 0.2–1.2)
Total Protein: 7.2 g/dL (ref 6.1–8.1)
eGFR: 101 mL/min/{1.73_m2} (ref >=60)

## 2022-01-13 LAB — LIPID PANEL
Cholesterol: 203 mg/dL — ABNORMAL HIGH (ref <200)
HDL: 75 mg/dL (ref >=50)
LDL Cholesterol (Calc): 111 mg/dL (calc) — ABNORMAL HIGH
Non-HDL Cholesterol (Calc): 128 mg/dL (calc) (ref <130)
Total CHOL/HDL Ratio: 2.7 (calc) (ref <5.0)
Triglycerides: 78 mg/dL (ref <150)

## 2022-01-13 LAB — HEMOGLOBIN A1C
Hgb A1c MFr Bld: 5.4 % of total Hgb (ref <5.7)
Mean Plasma Glucose: 108 mg/dL
eAG (mmol/L): 6 mmol/L

## 2022-01-13 LAB — TSH: TSH: 0.57 mIU/L (ref 0.40–4.50)

## 2022-01-13 LAB — VITAMIN D 25 HYDROXY (VIT D DEFICIENCY, FRACTURES): Vit D, 25-Hydroxy: 17 ng/mL — ABNORMAL LOW (ref 30–100)

## 2022-01-13 MED ORDER — VITAMIN D3 50 MCG (2000 UT) PO CAPS: 2000.0000 [IU] | ORAL_CAPSULE | Freq: Every day | ORAL | 3 refills | Status: AC

## 2022-05-12 ENCOUNTER — Ambulatory Visit: Payer: Self-pay | Admitting: *Deleted

## 2022-05-12 ENCOUNTER — Telehealth: Payer: 59 | Admitting: Family

## 2022-05-12 DIAGNOSIS — J069 Acute upper respiratory infection, unspecified: Secondary | ICD-10-CM

## 2022-05-12 MED ORDER — CETIRIZINE HCL 10 MG PO TABS: 10.0000 mg | ORAL_TABLET | Freq: Every day | ORAL | 0 refills | Status: DC

## 2022-05-12 MED ORDER — BENZONATATE 100 MG PO CAPS: 100.0000 mg | ORAL_CAPSULE | Freq: Three times a day (TID) | ORAL | 0 refills | Status: DC | PRN

## 2022-05-12 MED ORDER — FLUTICASONE PROPIONATE 50 MCG/ACT NA SUSP: 2.0000 | Freq: Every day | NASAL | 6 refills | Status: DC

## 2022-05-12 NOTE — Patient Instructions (Signed)

## 2022-05-12 NOTE — Progress Notes (Signed)
Virtual Visit Consent   Pamela Nichols, you are scheduled for a virtual visit with a [facility] provider today. Just as with appointments in the office, your consent must be obtained to participate. Your consent will be active for this visit and any virtual visit you may have with one of our providers in the next 365 days. If you have a MyChart account, a copy of this consent can be sent to you electronically.  As this is a virtual visit, video technology does not allow for your provider to perform a traditional examination. This may limit your provider's ability to fully assess your condition. If your provider identifies any concerns that need to be evaluated in person or the need to arrange testing (such as labs, EKG, etc.), we will make arrangements to do so. Although advances in technology are sophisticated, we cannot ensure that it will always work on either your end or our end. If the connection with a video visit is poor, the visit may have to be switched to a telephone visit. With either a video or telephone visit, we are not always able to ensure that we have a secure connection.  By engaging in this virtual visit, you consent to the provision of healthcare and authorize for your insurance to be billed (if applicable) for the services provided during this visit. Depending on your insurance coverage, you may receive a charge related to this service.  I need to obtain your verbal consent now. Are you willing to proceed with your visit today? Pamela Nichols has provided verbal consent on 05/12/2022 for a virtual visit (video or telephone). Pamela Dun, FNP  Date: 05/12/2022 7:14 PM  Virtual Visit via Video Note   I, Pamela Nichols, connected with  Pamela Nichols  ([id number], 1965-10-05) on 05/12/22 at  7:15 PM EST by a video-enabled telemedicine application and verified that I am speaking with the correct person using two identifiers.  Location: Patient: Virtual Visit Location  Patient: Home Provider: Virtual Visit Location Provider: Home Office   I discussed the limitations of evaluation and management by telemedicine and the availability of in person appointments. The patient expressed understanding and agreed to proceed.    History of Present Illness: Pamela Nichols is a 56 y.o. who identifies as a female who was assigned female at birth, and is being seen today for cough and headache that started three days ago.  HPI: URI  This is a new problem. The current episode started in the past 7 days. The problem has been waxing and waning. There has been no fever. Associated symptoms include congestion, coughing, headaches, rhinorrhea and sneezing. Pertinent negatives include no ear pain, sinus pain or sore throat. She has tried decongestant for the symptoms. The treatment provided mild relief.    Problems:  Patient Active Problem List   Diagnosis Date Noted   Subclinical hyperthyroidism 01/12/2022   History of abnormal TSH 01/12/2022   Hyperlipidemia 01/12/2022   S/P hysterectomy 01/26/2018   Class 1 obesity without serious comorbidity with body mass index (BMI) of 33.0 to 33.9 in adult 06/25/2015   Constipation 07/10/2013   Vitamin D deficiency 07/10/2013    Allergies: No Known Allergies Medications:  Current Outpatient Medications:    benzonatate (TESSALON PERLES) 100 MG capsule, Take 1 capsule (100 mg total) by mouth 3 (three) times daily as needed., Disp: 20 capsule, Rfl: 0   cetirizine (ZYRTEC ALLERGY) 10 MG tablet, Take 1 tablet (10 mg total) by mouth daily., Disp: 90 tablet, Rfl: 0  fluticasone (FLONASE) 50 MCG/ACT nasal spray, Place 2 sprays into both nostrils daily., Disp: 16 g, Rfl: 6   Cholecalciferol (VITAMIN D3) 50 MCG (2000 UT) capsule, Take 1 capsule (2,000 Units total) by mouth daily., Disp: 90 capsule, Rfl: 3  Observations/Objective: Patient is well-developed, well-nourished in no acute distress.  Resting comfortably  at home.  Head is  normocephalic, atraumatic.  No labored breathing.  Speech is clear and coherent with logical content.  Patient is alert and oriented at baseline.  Nasal congestion  Assessment and Plan: 1. Viral URI - cetirizine (ZYRTEC ALLERGY) 10 MG tablet; Take 1 tablet (10 mg total) by mouth daily.  Dispense: 90 tablet; Refill: 0 - fluticasone (FLONASE) 50 MCG/ACT nasal spray; Place 2 sprays into both nostrils daily.  Dispense: 16 g; Refill: 6 - benzonatate (TESSALON PERLES) 100 MG capsule; Take 1 capsule (100 mg total) by mouth 3 (three) times daily as needed.  Dispense: 20 capsule; Refill: 0  - Take meds as prescribed - Use a cool mist humidifier  -Use saline nose sprays frequently -Force fluids -For any cough or congestion  Use plain Mucinex- regular strength or max strength is fine -For fever or aces or pains- take tylenol or ibuprofen. -Throat lozenges if help -Follow up if symptoms worsen or do not improve   Follow Up Instructions: I discussed the assessment and treatment plan with the patient. The patient was provided an opportunity to ask questions and all were answered. The patient agreed with the plan and demonstrated an understanding of the instructions.  A copy of instructions were sent to the patient via MyChart unless otherwise noted below.     The patient was advised to call back or seek an in-person evaluation if the symptoms worsen or if the condition fails to improve as anticipated.  Time:  I spent 11 minutes with the patient via telehealth technology discussing the above problems/concerns.    Pamela Dun, FNP

## 2022-05-12 NOTE — Telephone Encounter (Signed)
  Chief Complaint: Headache Symptoms: Frontal headache, mild sinus pressure, runny nose, scratchy throat, body aches, subjective fever. Frequency: 3 days Pertinent Negatives: Patient denies SOB Disposition: '[]'$ ED /'[]'$ Urgent Care (no appt availability in office) / '[]'$ Appointment(In office/virtual)/ '[x]'$  [facility]/ '[]'$ Home Care/ '[]'$ Refused Recommended Disposition /'[]'$ [facility]/ '[]'$  Follow-up with PCP Additional Notes: After hours call. Secured [facility] virtual for pt.Process reviewed, pt verbalizes understanding.  Advised to covid test prior to appt. Pt has home test available. Reason for Disposition  [1] Sinus pain of forehead AND [2] yellow or green nasal discharge  Answer Assessment - Initial Assessment Questions 1. LOCATION: "Where does it hurt?"      Frontal headache, little sinus pressure 2. ONSET: "When did the headache start?" (Minutes, hours or days)      3 days ago 3. PATTERN: "Does the pain come and go, or has it been constant since it started?"     Comes and goes 4. SEVERITY: "How bad is the pain?" and "What does it keep you from doing?"  (e.g., Scale 1-10; mild, moderate, or severe)   - MILD (1-3): doesn't interfere with normal activities    - MODERATE (4-7): interferes with normal activities or awakens from sleep    - SEVERE (8-10): excruciating pain, unable to do any normal activities        8-9/10. IBU, tylenol helps 5. RECURRENT SYMPTOM: "Have you ever had headaches before?" If Yes, ask: "When was the last time?" and "What happened that time?"       6. CAUSE: "What do you think is causing the headache?"     "Flu" 7. MIGRAINE: "Have you been diagnosed with migraine headaches?" If Yes, ask: "Is this headache similar?"       8. HEAD INJURY: "Has there been any recent injury to the head?"      no 9. OTHER SYMPTOMS: "Do you have any other symptoms?" (fever, stiff neck, eye pain, sore throat, cold symptoms)     Body aches, feels hot, chills, runny nose, cough  at times, voice hoarse, scratchy throat.  Protocols used: [facility]

## 2022-05-13 NOTE — Telephone Encounter (Signed)
Will need appointment.

## 2022-05-13 NOTE — Telephone Encounter (Signed)
Tried to call pt to see if she needed an appt. No answer and No VM

## 2022-06-08 ENCOUNTER — Emergency Department (HOSPITAL_COMMUNITY)
Admission: EM | Admit: 2022-06-08 | Discharge: 2022-06-08 | Disposition: A | Discharge status: Home / Self Care | Payer: 59 | Attending: Emergency Medicine | Admitting: Emergency Medicine

## 2022-06-08 ENCOUNTER — Emergency Department (HOSPITAL_COMMUNITY): Payer: 59

## 2022-06-08 ENCOUNTER — Encounter (HOSPITAL_COMMUNITY): Payer: Self-pay | Admitting: Emergency Medicine

## 2022-06-08 ENCOUNTER — Other Ambulatory Visit: Payer: Self-pay

## 2022-06-08 DIAGNOSIS — M542 Cervicalgia: Secondary | ICD-10-CM | POA: Diagnosis not present

## 2022-06-08 DIAGNOSIS — R519 Headache, unspecified: Secondary | ICD-10-CM | POA: Diagnosis not present

## 2022-06-08 DIAGNOSIS — S060X0A Concussion without loss of consciousness, initial encounter: Secondary | ICD-10-CM | POA: Insufficient documentation

## 2022-06-08 DIAGNOSIS — S0990XA Unspecified injury of head, initial encounter: Secondary | ICD-10-CM | POA: Diagnosis not present

## 2022-06-08 DIAGNOSIS — W19XXXA Unspecified fall, initial encounter: Secondary | ICD-10-CM

## 2022-06-08 DIAGNOSIS — W0110XA Fall on same level from slipping, tripping and stumbling with subsequent striking against unspecified object, initial encounter: Secondary | ICD-10-CM | POA: Diagnosis not present

## 2022-06-08 DIAGNOSIS — S199XXA Unspecified injury of neck, initial encounter: Secondary | ICD-10-CM | POA: Diagnosis not present

## 2022-06-08 MED ORDER — ONDANSETRON 4 MG PO TBDP: ORAL_TABLET | ORAL | 0 refills | Status: DC

## 2022-06-08 MED ORDER — IBUPROFEN 800 MG PO TABS: 800.0000 mg | ORAL_TABLET | Freq: Three times a day (TID) | ORAL | 0 refills | Status: DC | PRN

## 2022-06-08 NOTE — ED Triage Notes (Addendum)
Pt slipped and fell on ice x5days ago. Pt c/o of right sided head and neck pain. States Saturday she had one episode of emesis. Wants to be checked for concussion. Denies LOC, NO blood thinners.

## 2022-06-08 NOTE — ED Provider Notes (Signed)
[facility] Provider Note   CSN: [id number] Arrival date & time: 06/08/22  0720     History  Chief Complaint  Patient presents with   Pamela Nichols    Pamela Nichols is a 57 y.o. female.  Patient states she fell hit her head couple weeks ago and has had a headache and nausea since.  No other medical problems  The history is provided by the patient and medical records. No language interpreter was used.  Fall This is a new problem. The current episode started more than 2 days ago. The problem occurs rarely. The problem has been resolved. Pertinent negatives include no chest pain, no abdominal pain and no headaches. Nothing aggravates the symptoms. Nothing relieves the symptoms. She has tried nothing for the symptoms. The treatment provided no relief.       Home Medications Prior to Admission medications   Medication Sig Start Date End Date Taking? Authorizing Provider  ibuprofen (ADVIL) 800 MG tablet Take 1 tablet (800 mg total) by mouth every 8 (eight) hours as needed for moderate pain. 06/08/22  Yes Milton Ferguson, MD  ondansetron (ZOFRAN-ODT) 4 MG disintegrating tablet '4mg'$  ODT q4 hours prn nausea/vomit 06/08/22  Yes Milton Ferguson, MD  benzonatate (TESSALON PERLES) 100 MG capsule Take 1 capsule (100 mg total) by mouth 3 (three) times daily as needed. 05/12/22   Sharion Balloon, FNP  cetirizine (ZYRTEC ALLERGY) 10 MG tablet Take 1 tablet (10 mg total) by mouth daily. 05/12/22   Evelina Dun A, FNP  Cholecalciferol (VITAMIN D3) 50 MCG (2000 UT) capsule Take 1 capsule (2,000 Units total) by mouth daily. 01/13/22   Bo Merino, FNP  fluticasone (FLONASE) 50 MCG/ACT nasal spray Place 2 sprays into both nostrils daily. 05/12/22   Sharion Balloon, FNP      Allergies    Patient has no known allergies.    Review of Systems   Review of Systems  Constitutional:  Negative for appetite change and fatigue.  HENT:  Negative for congestion,  ear discharge and sinus pressure.   Eyes:  Negative for discharge.  Respiratory:  Negative for cough.   Cardiovascular:  Negative for chest pain.  Gastrointestinal:  Negative for abdominal pain and diarrhea.  Genitourinary:  Negative for frequency and hematuria.  Musculoskeletal:  Negative for back pain.  Skin:  Negative for rash.  Neurological:  Negative for seizures and headaches.       Headaches  Psychiatric/Behavioral:  Negative for hallucinations.     Physical Exam Updated Vital Signs BP (!) 109/92   Pulse 72   Temp 98.1 F (36.7 C) (Oral)   Resp 16   Ht '5\' 2"'$  (1.575 m)   Wt 77.1 kg   LMP 04/02/2014   SpO2 99%   BMI 31.09 kg/m  Physical Exam Vitals and nursing note reviewed.  Constitutional:      Appearance: She is well-developed.  HENT:     Head: Normocephalic.  Eyes:     General: No scleral icterus.    Conjunctiva/sclera: Conjunctivae normal.  Neck:     Thyroid: No thyromegaly.  Cardiovascular:     Rate and Rhythm: Normal rate and regular rhythm.     Heart sounds: No murmur heard.    No friction rub. No gallop.  Pulmonary:     Breath sounds: No stridor. No wheezing or rales.  Chest:     Chest wall: No tenderness.  Abdominal:     General: There is no distension.  Tenderness: There is no abdominal tenderness. There is no rebound.  Musculoskeletal:        General: Normal range of motion.     Cervical back: Neck supple.  Lymphadenopathy:     Cervical: No cervical adenopathy.  Skin:    Findings: No erythema or rash.  Neurological:     Mental Status: She is oriented to person, place, and time.     Motor: No abnormal muscle tone.     Coordination: Coordination normal.  Psychiatric:        Behavior: Behavior normal.     ED Results / Procedures / Treatments   Labs (all labs ordered are listed, but only abnormal results are displayed) Labs Reviewed - No data to display  EKG None  Radiology CT Head Wo Contrast  Result Date: 06/08/2022 CLINICAL  DATA:  Head trauma, abnormal mental status (Age 59-64y); Neck trauma, impaired ROM (Age 49-64y) EXAM: CT HEAD WITHOUT CONTRAST CT CERVICAL SPINE WITHOUT CONTRAST TECHNIQUE: Multidetector CT imaging of the head and cervical spine was performed following the standard protocol without intravenous contrast. Multiplanar CT image reconstructions of the cervical spine were also generated. RADIATION DOSE REDUCTION: This exam was performed according to the departmental dose-optimization program which includes automated exposure control, adjustment of the mA and/or kV according to patient size and/or use of iterative reconstruction technique. COMPARISON:  None Available. FINDINGS: CT HEAD FINDINGS Brain: No evidence of acute infarction, hemorrhage, hydrocephalus, extra-axial collection or mass lesion/mass effect. Vascular: No hyperdense vessel or unexpected calcification. Skull: Normal. Negative for fracture or focal lesion. Sinuses/Orbits: No acute finding. Other: None. CT CERVICAL SPINE FINDINGS Alignment: Facet joints are aligned without dislocation or traumatic listhesis. Dens and lateral masses are aligned. Skull base and vertebrae: No acute fracture. No primary bone lesion or focal pathologic process. Soft tissues and spinal canal: No prevertebral fluid or swelling. No visible canal hematoma. Disc levels: Intervertebral disc heights are preserved. Mild facet joint arthropathy. Upper chest: Negative. Other: Negative. IMPRESSION: 1. No acute intracranial abnormality. 2. No acute fracture or subluxation of the cervical spine. Electronically Signed   By: Davina Poke D.O.   On: 06/08/2022 09:26   CT Cervical Spine Wo Contrast  Result Date: 06/08/2022 CLINICAL DATA:  Head trauma, abnormal mental status (Age 70-64y); Neck trauma, impaired ROM (Age 53-64y) EXAM: CT HEAD WITHOUT CONTRAST CT CERVICAL SPINE WITHOUT CONTRAST TECHNIQUE: Multidetector CT imaging of the head and cervical spine was performed following the  standard protocol without intravenous contrast. Multiplanar CT image reconstructions of the cervical spine were also generated. RADIATION DOSE REDUCTION: This exam was performed according to the departmental dose-optimization program which includes automated exposure control, adjustment of the mA and/or kV according to patient size and/or use of iterative reconstruction technique. COMPARISON:  None Available. FINDINGS: CT HEAD FINDINGS Brain: No evidence of acute infarction, hemorrhage, hydrocephalus, extra-axial collection or mass lesion/mass effect. Vascular: No hyperdense vessel or unexpected calcification. Skull: Normal. Negative for fracture or focal lesion. Sinuses/Orbits: No acute finding. Other: None. CT CERVICAL SPINE FINDINGS Alignment: Facet joints are aligned without dislocation or traumatic listhesis. Dens and lateral masses are aligned. Skull base and vertebrae: No acute fracture. No primary bone lesion or focal pathologic process. Soft tissues and spinal canal: No prevertebral fluid or swelling. No visible canal hematoma. Disc levels: Intervertebral disc heights are preserved. Mild facet joint arthropathy. Upper chest: Negative. Other: Negative. IMPRESSION: 1. No acute intracranial abnormality. 2. No acute fracture or subluxation of the cervical spine. Electronically Signed   By:  Nicholas  Plundo D.O.   On: 06/08/2022 09:26    Procedures Procedures    Medications Ordered in ED Medications - No data to display  ED Course/ Medical Decision Making/ A&P                             Medical Decision Making Amount and/or Complexity of Data Reviewed Radiology: ordered.  Risk Prescription drug management.  This patient presents to the ED for concern of headaches, this involves an extensive number of treatment options, and is a complaint that carries with it a high risk of complications and morbidity.  The differential diagnosis includes head injury, concussion   Co morbidities that  complicate the patient evaluation  None   Additional history obtained:  Additional history obtained from patient External records from outside source obtained and reviewed including hospital records   Lab Tests:  No labs  Imaging Studies ordered:  I ordered imaging studies including CT head I independently visualized and interpreted imaging which showed negative I agree with the radiologist interpretation   Cardiac Monitoring: / EKG:  The patient was maintained on a cardiac monitor.  I personally viewed and interpreted the cardiac monitored which showed an underlying rhythm of: Normal sinus rhythm   Consultations Obtained:  No consult  Problem List / ED Course / Critical interventions / Medication management  Headache No medicines given Reevaluation of the patient after these medicines showed that the patient stayed the same I have reviewed the patients home medicines and have made adjustments as needed   Social Determinants of Health:  None   Test / Admission - Considered:  None  Patient with mild concussion from head injury.  CT scan negative.  She is given Zofran and Motrin and followed up with neurology if needed        Final Clinical Impression(s) / ED Diagnoses Final diagnoses:  Fall, initial encounter  Injury of head, initial encounter    Rx / DC Orders ED Discharge Orders          Ordered    ondansetron (ZOFRAN-ODT) 4 MG disintegrating tablet        06/08/22 1055    ibuprofen (ADVIL) 800 MG tablet  Every 8 hours PRN        06/08/22 1055              Milton Ferguson, MD 06/09/22 1130

## 2022-06-08 NOTE — Discharge Instructions (Addendum)
Follow-up with [facility] neurology or your family doctor in the next week  if continued with symptoms

## 2022-07-13 ENCOUNTER — Ambulatory Visit: Payer: 59 | Admitting: Nurse Practitioner

## 2022-07-13 NOTE — Progress Notes (Deleted)
LMP 04/02/2014    Subjective:    Patient ID: Pamela Nichols, female    DOB: August 16, 1965, 57 y.o.   MRN: [id number]  HPI: Pamela Nichols is a 57 y.o. female  No chief complaint on file.  Subclinical hyperthyroidism/history of abnormal TSH:  TSH was 0.57 on 01/12/2022, TSH was 0.54 on 07/14/2021.  Prior to that her TSH was 0.27 on 02/27/2021.  Denies any palpitations, changes in skin or heat and cold intolerance.  Obesity: She is going to the gym two days a week.  She is also walking in the evening. She is working on eating healthier.  Her weight at last visit was  176 lbs with a BMI of 32.25. today her weight is *** with a BMI of ***.   Hyperlipidemia: Her last LDL was 111 on 01/12/2022.  She is not currently on medication.  She is working on life style modification. The 10-year ASCVD risk score (Arnett DK, et al., 2019) is: 2.5%   Values used to calculate the score:     Age: 68 years     Sex: Female     Is Non-Hispanic African American: Yes     Diabetic: No     Tobacco smoker: No     Systolic Blood Pressure: [id number]     Is BP treated: No     HDL Cholesterol: 75 mg/dL     Total Cholesterol: 203 mg/dL   Vitamin D deficiency:her last vitamin d level was 17 on 01/12/2022. She was prescribed vitamin d supplementation. Patient reports ***.      01/12/2022    1:33 PM 07/14/2021    2:03 PM 02/24/2021    1:09 PM 06/24/2020   11:07 AM 05/31/2020    8:59 AM  Depression screen PHQ 2/9  Decreased Interest 0 0 0 0 0  Down, Depressed, Hopeless 0 0 0 0 0  PHQ - 2 Score 0 0 0 0 0  Altered sleeping 1 0   0  Tired, decreased energy 1 0   0  Change in appetite 2 0   0  Feeling bad or failure about yourself  0 0   0  Trouble concentrating 0 0   0  Moving slowly or fidgety/restless 0 0   0  Suicidal thoughts 0 0   0  PHQ-9 Score 4 0   0  Difficult doing work/chores Not difficult at all Not difficult at all       Relevant past medical, surgical, family and social history reviewed and  updated as indicated. Interim medical history since our last visit reviewed. Allergies and medications reviewed and updated.  Review of Systems  Constitutional: Negative for fever or weight change.  Respiratory: Negative for cough and shortness of breath.   Cardiovascular: Negative for chest pain or palpitations.  Gastrointestinal: Negative for abdominal pain, no bowel changes.  Musculoskeletal: Negative for gait problem or joint swelling.  Skin: Negative for rash.  Neurological: Negative for dizziness or headache.  No other specific complaints in a complete review of systems (except as listed in HPI above).      Objective:    LMP 04/02/2014   Wt Readings from Last 3 Encounters:  06/08/22 170 lb (77.1 kg)  01/12/22 176 lb 4.8 oz (80 kg)  07/14/21 180 lb 9.6 oz (81.9 kg)    Physical Exam  Constitutional: Patient appears well-developed and well-nourished. Obese  No distress.  HEENT: head atraumatic, normocephalic, pupils equal and reactive to light, neck supple Cardiovascular: Normal  rate, regular rhythm and normal heart sounds.  No murmur heard. No BLE edema. Pulmonary/Chest: Effort normal and breath sounds normal. No respiratory distress. Abdominal: Soft.  There is no tenderness. Psychiatric: Patient has a normal mood and affect. behavior is normal. Judgment and thought content normal.   Results for orders placed or performed in visit on 01/12/22  Lipid panel  Result Value Ref Range   Cholesterol 203 (H) <200 mg/dL   HDL 75 > OR = 50 mg/dL   Triglycerides 78 <150 mg/dL   LDL Cholesterol (Calc) 111 (H) mg/dL (calc)   Total CHOL/HDL Ratio 2.7 <5.0 (calc)   Non-HDL Cholesterol (Calc) 128 <130 mg/dL (calc)  CBC with Differential/Platelet  Result Value Ref Range   WBC 3.8 3.8 - 10.8 Thousand/uL   RBC 4.34 3.80 - 5.10 Million/uL   Hemoglobin 12.3 11.7 - 15.5 g/dL   HCT 38.2 35.0 - 45.0 %   MCV 88.0 80.0 - 100.0 fL   MCH 28.3 27.0 - 33.0 pg   MCHC 32.2 32.0 - 36.0 g/dL    RDW 13.2 11.0 - 15.0 %   Platelets 199 140 - 400 Thousand/uL   MPV 10.6 7.5 - 12.5 fL   Neutro Abs 1,516 1,500 - 7,800 cells/uL   Lymphs Abs 1,790 850 - 3,900 cells/uL   Absolute Monocytes 353 200 - 950 cells/uL   Eosinophils Absolute 122 15 - 500 cells/uL   Basophils Absolute 19 0 - 200 cells/uL   Neutrophils Relative % 39.9 %   Total Lymphocyte 47.1 %   Monocytes Relative 9.3 %   Eosinophils Relative 3.2 %   Basophils Relative 0.5 %  COMPLETE METABOLIC PANEL WITH GFR  Result Value Ref Range   Glucose, Bld 77 65 - 99 mg/dL   BUN 9 7 - 25 mg/dL   Creat 0.69 0.50 - 1.03 mg/dL   eGFR 101 > OR = 60 mL/min/1.32m   BUN/Creatinine Ratio SEE NOTE: 6 - 22 (calc)   Sodium 144 135 - 146 mmol/L   Potassium 3.9 3.5 - 5.3 mmol/L   Chloride 107 98 - 110 mmol/L   CO2 28 20 - 32 mmol/L   Calcium 9.6 8.6 - 10.4 mg/dL   Total Protein 7.2 6.1 - 8.1 g/dL   Albumin 4.3 3.6 - 5.1 g/dL   Globulin 2.9 1.9 - 3.7 g/dL (calc)   AG Ratio 1.5 1.0 - 2.5 (calc)   Total Bilirubin 0.4 0.2 - 1.2 mg/dL   Alkaline phosphatase (APISO) 79 37 - 153 U/L   AST 12 10 - 35 U/L   ALT 10 6 - 29 U/L  TSH  Result Value Ref Range   TSH 0.57 0.40 - 4.50 mIU/L  VITAMIN D 25 Hydroxy (Vit-D Deficiency, Fractures)  Result Value Ref Range   Vit D, 25-Hydroxy 17 (L) 30 - 100 ng/mL  Hemoglobin A1c  Result Value Ref Range   Hgb A1c MFr Bld 5.4 <5.7 % of total Hgb   Mean Plasma Glucose 108 mg/dL   eAG (mmol/L) 6.0 mmol/L      Assessment & Plan:   Problem List Items Addressed This Visit   None    Follow up plan: No follow-ups on file.

## 2022-11-30 ENCOUNTER — Encounter: Payer: Self-pay | Admitting: Nurse Practitioner

## 2022-11-30 ENCOUNTER — Ambulatory Visit: Payer: 59 | Admitting: Nurse Practitioner

## 2022-11-30 VITALS — BP 122/74 | HR 95 | Temp 98.0°F | Resp 16 | Ht 62.0 in | Wt 170.4 lb

## 2022-11-30 DIAGNOSIS — E0590 Thyrotoxicosis, unspecified without thyrotoxic crisis or storm: Secondary | ICD-10-CM

## 2022-11-30 DIAGNOSIS — Z1231 Encounter for screening mammogram for malignant neoplasm of breast: Secondary | ICD-10-CM | POA: Diagnosis not present

## 2022-11-30 DIAGNOSIS — Z6833 Body mass index (BMI) 33.0-33.9, adult: Secondary | ICD-10-CM

## 2022-11-30 DIAGNOSIS — Z130 Encounter for screening for diseases of the blood and blood-forming organs and certain disorders involving the immune mechanism: Secondary | ICD-10-CM

## 2022-11-30 DIAGNOSIS — R42 Dizziness and giddiness: Secondary | ICD-10-CM | POA: Diagnosis not present

## 2022-11-30 DIAGNOSIS — E669 Obesity, unspecified: Secondary | ICD-10-CM

## 2022-11-30 DIAGNOSIS — E785 Hyperlipidemia, unspecified: Secondary | ICD-10-CM

## 2022-11-30 DIAGNOSIS — E559 Vitamin D deficiency, unspecified: Secondary | ICD-10-CM | POA: Diagnosis not present

## 2022-11-30 DIAGNOSIS — Z131 Encounter for screening for diabetes mellitus: Secondary | ICD-10-CM | POA: Diagnosis not present

## 2022-11-30 LAB — CBC WITH DIFFERENTIAL/PLATELET
Eosinophils Relative: 1.3 %
HCT: 37.5 % (ref 35.0–45.0)
Neutro Abs: 1494 cells/uL — ABNORMAL LOW (ref 1500–7800)
Neutrophils Relative %: 33.2 %
Platelets: 209 10*3/uL (ref 140–400)
Total Lymphocyte: 56.3 %

## 2022-11-30 MED ORDER — MECLIZINE HCL 25 MG PO TABS: 25.0000 mg | ORAL_TABLET | Freq: Three times a day (TID) | ORAL | 0 refills | Status: DC | PRN

## 2022-11-30 NOTE — Progress Notes (Signed)
BP 122/74   Pulse 95   Temp 98 F (36.7 C) (Oral)   Resp 16   Ht 5\' 2"  (1.575 m)   Wt 170 lb 6.4 oz (77.3 kg)   LMP 04/02/2014   SpO2 97%   BMI 31.17 kg/m    Subjective:    Patient ID: Pamela Nichols, female    DOB: Jul 28, 1965, 57 y.o.   MRN: [id number]  HPI: Pamela Nichols is a 57 y.o. female  Chief Complaint  Patient presents with   Dizziness   Follow-up    6 month follow up   Subclinical hyperthyroidism/history of abnormal TSH: Last TSH was 0.57 on 01/12/2022.  Prior  TSH was 0.27 on 02/27/2021.  Denies any palpitations, changes in skin or heat and cold intolerance.she does reports some dizziness, will check labs.   Obesity:  Current weight : 170 lbs BMI: 31.17 previous weight:176 lbs Treatment Tried: life style modification Comorbidities: subclinical hyperthyroidism, HLD   HLD:  -Medications: none -Patient is working on lifestyle modification -Last lipid panel:  Lipid Panel     Component Value Date/Time   CHOL 203 (H) 01/12/2022 1347   TRIG 78 01/12/2022 1347   HDL 75 01/12/2022 1347   CHOLHDL 2.7 01/12/2022 1347   VLDL 14.2 09/09/2015 1416   LDLCALC 111 (H) 01/12/2022 1347   The 10-year ASCVD risk score (Arnett DK, et al., 2019) is: 3.4%   Values used to calculate the score:     Age: 29 years     Sex: Female     Is Non-Hispanic African American: Yes     Diabetic: No     Tobacco smoker: No     Systolic Blood Pressure: 122 mmHg     Is BP treated: No     HDL Cholesterol: 75 mg/dL     Total Cholesterol: 203 mg/dL     Vitamin D deficiency: Her last vitamin D was 17 on 01/12/2022. Patient reports she has been taking vitamin d supplement.  Will check level.     11/30/2022    2:46 PM 01/12/2022    1:33 PM 07/14/2021    2:03 PM 02/24/2021    1:09 PM 06/24/2020   11:07 AM  Depression screen PHQ 2/9  Decreased Interest 0 0 0 0 0  Down, Depressed, Hopeless 0 0 0 0 0  PHQ - 2 Score 0 0 0 0 0  Altered sleeping  1 0    Tired, decreased energy  1 0     Change in appetite  2 0    Feeling bad or failure about yourself   0 0    Trouble concentrating  0 0    Moving slowly or fidgety/restless  0 0    Suicidal thoughts  0 0    PHQ-9 Score  4 0    Difficult doing work/chores  Not difficult at all Not difficult at all      Dizziness: patient reports she occasionally will get dizzy spells.  She says that she has been experiencing this for about a year.  She reports that it lasts for a few seconds to 10-15 minutes.  No chest pain, headaches, or shortness of breath. She says it happens more when she does not get enough sleep.   She says nothing makes it worse.  She says nothing makes it better. Will get labs.  She would like to try meclizine.    Relevant past medical, surgical, family and social history reviewed and updated as indicated. Interim medical  history since our last visit reviewed. Allergies and medications reviewed and updated.  Review of Systems  Constitutional: Negative for fever or weight change.  Respiratory: Negative for cough and shortness of breath.   Cardiovascular: Negative for chest pain or palpitations.  Gastrointestinal: Negative for abdominal pain, no bowel changes.  Musculoskeletal: Negative for gait problem or joint swelling.  Skin: Negative for rash.  Neurological: positive for dizziness , negative for  headache.  No other specific complaints in a complete review of systems (except as listed in HPI above).      Objective:    BP 122/74   Pulse 95   Temp 98 F (36.7 C) (Oral)   Resp 16   Ht 5\' 2"  (1.575 m)   Wt 170 lb 6.4 oz (77.3 kg)   LMP 04/02/2014   SpO2 97%   BMI 31.17 kg/m   Wt Readings from Last 3 Encounters:  11/30/22 170 lb 6.4 oz (77.3 kg)  06/08/22 170 lb (77.1 kg)  01/12/22 176 lb 4.8 oz (80 kg)    Physical Exam  Constitutional: Patient appears well-developed and well-nourished. Obese  No distress.  HEENT: head atraumatic, normocephalic, pupils equal and reactive to light, neck  supple Cardiovascular: Normal rate, regular rhythm and normal heart sounds.  No murmur heard. No BLE edema. Pulmonary/Chest: Effort normal and breath sounds normal. No respiratory distress. Abdominal: Soft.  There is no tenderness. Neuro: negative romberg test, equal grip, normal gait Psychiatric: Patient has a normal mood and affect. behavior is normal. Judgment and thought content normal.   Results for orders placed or performed in visit on 01/12/22  Lipid panel  Result Value Ref Range   Cholesterol 203 (H) <200 mg/dL   HDL 75 > OR = 50 mg/dL   Triglycerides 78 <161 mg/dL   LDL Cholesterol (Calc) 111 (H) mg/dL (calc)   Total CHOL/HDL Ratio 2.7 <5.0 (calc)   Non-HDL Cholesterol (Calc) 128 <130 mg/dL (calc)  CBC with Differential/Platelet  Result Value Ref Range   WBC 3.8 3.8 - 10.8 Thousand/uL   RBC 4.34 3.80 - 5.10 Million/uL   Hemoglobin 12.3 11.7 - 15.5 g/dL   HCT 09.6 04.5 - 40.9 %   MCV 88.0 80.0 - 100.0 fL   MCH 28.3 27.0 - 33.0 pg   MCHC 32.2 32.0 - 36.0 g/dL   RDW 81.1 91.4 - 78.2 %   Platelets 199 140 - 400 Thousand/uL   MPV 10.6 7.5 - 12.5 fL   Neutro Abs 1,516 1,500 - 7,800 cells/uL   Lymphs Abs 1,790 850 - 3,900 cells/uL   Absolute Monocytes 353 200 - 950 cells/uL   Eosinophils Absolute 122 15 - 500 cells/uL   Basophils Absolute 19 0 - 200 cells/uL   Neutrophils Relative % 39.9 %   Total Lymphocyte 47.1 %   Monocytes Relative 9.3 %   Eosinophils Relative 3.2 %   Basophils Relative 0.5 %  COMPLETE METABOLIC PANEL WITH GFR  Result Value Ref Range   Glucose, Bld 77 65 - 99 mg/dL   BUN 9 7 - 25 mg/dL   Creat 9.56 2.13 - 0.86 mg/dL   eGFR 578 > OR = 60 IO/NGE/9.52W4   BUN/Creatinine Ratio SEE NOTE: 6 - 22 (calc)   Sodium 144 135 - 146 mmol/L   Potassium 3.9 3.5 - 5.3 mmol/L   Chloride 107 98 - 110 mmol/L   CO2 28 20 - 32 mmol/L   Calcium 9.6 8.6 - 10.4 mg/dL   Total Protein 7.2 6.1 -  8.1 g/dL   Albumin 4.3 3.6 - 5.1 g/dL   Globulin 2.9 1.9 - 3.7 g/dL (calc)    AG Ratio 1.5 1.0 - 2.5 (calc)   Total Bilirubin 0.4 0.2 - 1.2 mg/dL   Alkaline phosphatase (APISO) 79 37 - 153 U/L   AST 12 10 - 35 U/L   ALT 10 6 - 29 U/L  TSH  Result Value Ref Range   TSH 0.57 0.40 - 4.50 mIU/L  VITAMIN D 25 Hydroxy (Vit-D Deficiency, Fractures)  Result Value Ref Range   Vit D, 25-Hydroxy 17 (L) 30 - 100 ng/mL  Hemoglobin A1c  Result Value Ref Range   Hgb A1c MFr Bld 5.4 <5.7 % of total Hgb   Mean Plasma Glucose 108 mg/dL   eAG (mmol/L) 6.0 mmol/L      Assessment & Plan:   Problem List Items Addressed This Visit       Endocrine   Subclinical hyperthyroidism    Rechecking labs      Relevant Orders   TSH     Other   Vitamin D deficiency - Primary    Currently taking vitamin d supplement, will check level      Relevant Orders   VITAMIN D 25 Hydroxy (Vit-D Deficiency, Fractures)   Class 1 obesity without serious comorbidity with body mass index (BMI) of 33.0 to 33.9 in adult    Has lost about 6 lbs from last visit. Continue working on lifestyle modification.       Hyperlipidemia    Getting labs, continue working on lifestyle modification      Relevant Orders   COMPLETE METABOLIC PANEL WITH GFR   Lipid panel   Other Visit Diagnoses     Screening for deficiency anemia       Relevant Orders   CBC with Differential/Platelet   Screening for diabetes mellitus       Relevant Orders   Hemoglobin A1c   Dizziness       getting labs, trial meclizine, increase water intake   Relevant Medications   meclizine (ANTIVERT) 25 MG tablet   Other Relevant Orders   CBC with Differential/Platelet   COMPLETE METABOLIC PANEL WITH GFR   TSH   Vitamin B12   Screening mammogram for breast cancer       Relevant Orders   MM 3D SCREENING MAMMOGRAM BILATERAL BREAST         Follow up plan: Return in about 6 months (around 06/02/2023) for cpe.

## 2022-11-30 NOTE — Assessment & Plan Note (Signed)
Has lost about 6 lbs from last visit. Continue working on lifestyle modification.

## 2022-11-30 NOTE — Assessment & Plan Note (Signed)
 Rechecking labs 

## 2022-11-30 NOTE — Assessment & Plan Note (Signed)
Currently taking vitamin d supplement, will check level

## 2022-11-30 NOTE — Assessment & Plan Note (Signed)
Getting labs, continue working on lifestyle modification

## 2022-12-01 LAB — COMPLETE METABOLIC PANEL WITH GFR
AG Ratio: 1.5 (calc) (ref 1.0–2.5)
ALT: 14 U/L (ref 6–29)
AST: 15 U/L (ref 10–35)
Albumin: 4.5 g/dL (ref 3.6–5.1)
Alkaline phosphatase (APISO): 90 U/L (ref 37–153)
BUN: 10 mg/dL (ref 7–25)
CO2: 28 mmol/L (ref 20–32)
Calcium: 9.7 mg/dL (ref 8.6–10.4)
Chloride: 106 mmol/L (ref 98–110)
Creat: 0.7 mg/dL (ref 0.50–1.03)
Globulin: 3 g/dL (calc) (ref 1.9–3.7)
Glucose, Bld: 80 mg/dL (ref 65–99)
Potassium: 4.1 mmol/L (ref 3.5–5.3)
Sodium: 141 mmol/L (ref 135–146)
Total Bilirubin: 0.6 mg/dL (ref 0.2–1.2)
Total Protein: 7.5 g/dL (ref 6.1–8.1)
eGFR: 100 mL/min/{1.73_m2} (ref >=60)

## 2022-12-01 LAB — CBC WITH DIFFERENTIAL/PLATELET
Absolute Monocytes: 383 cells/uL (ref 200–950)
Basophils Absolute: 32 cells/uL (ref 0–200)
Basophils Relative: 0.7 %
Eosinophils Absolute: 59 cells/uL (ref 15–500)
Hemoglobin: 12.2 g/dL (ref 11.7–15.5)
Lymphs Abs: 2534 cells/uL (ref 850–3900)
MCH: 28.2 pg (ref 27.0–33.0)
MCHC: 32.5 g/dL (ref 32.0–36.0)
MCV: 86.8 fL (ref 80.0–100.0)
MPV: 10.5 fL (ref 7.5–12.5)
Monocytes Relative: 8.5 %
RBC: 4.32 10*6/uL (ref 3.80–5.10)
RDW: 12.9 % (ref 11.0–15.0)
WBC: 4.5 10*3/uL (ref 3.8–10.8)

## 2022-12-01 LAB — VITAMIN D 25 HYDROXY (VIT D DEFICIENCY, FRACTURES): Vit D, 25-Hydroxy: 49 ng/mL (ref 30–100)

## 2022-12-01 LAB — LIPID PANEL
Cholesterol: 202 mg/dL — ABNORMAL HIGH (ref <200)
HDL: 93 mg/dL (ref >=50)
LDL Cholesterol (Calc): 93 mg/dL (calc)
Non-HDL Cholesterol (Calc): 109 mg/dL (calc) (ref <130)
Total CHOL/HDL Ratio: 2.2 (calc) (ref <5.0)
Triglycerides: 69 mg/dL (ref <150)

## 2022-12-01 LAB — HEMOGLOBIN A1C
Hgb A1c MFr Bld: 5.7 % of total Hgb — ABNORMAL HIGH (ref <5.7)
Mean Plasma Glucose: 117 mg/dL
eAG (mmol/L): 6.5 mmol/L

## 2022-12-01 LAB — VITAMIN B12: Vitamin B-12: 659 pg/mL (ref 200–1100)

## 2022-12-01 LAB — TSH: TSH: 0.54 mIU/L (ref 0.40–4.50)

## 2023-01-04 ENCOUNTER — Ambulatory Visit
Admission: RE | Admit: 2023-01-04 | Discharge: 2023-01-04 | Disposition: A | Discharge status: Home / Self Care | Payer: 59 | Source: Ambulatory Visit | Attending: Nurse Practitioner | Admitting: Nurse Practitioner

## 2023-01-04 DIAGNOSIS — Z1231 Encounter for screening mammogram for malignant neoplasm of breast: Secondary | ICD-10-CM | POA: Insufficient documentation

## 2023-03-29 DIAGNOSIS — Z90710 Acquired absence of both cervix and uterus: Secondary | ICD-10-CM | POA: Diagnosis not present

## 2023-03-29 DIAGNOSIS — E785 Hyperlipidemia, unspecified: Secondary | ICD-10-CM | POA: Diagnosis not present

## 2023-03-29 DIAGNOSIS — Z8249 Family history of ischemic heart disease and other diseases of the circulatory system: Secondary | ICD-10-CM | POA: Diagnosis not present

## 2023-03-29 DIAGNOSIS — Z818 Family history of other mental and behavioral disorders: Secondary | ICD-10-CM | POA: Diagnosis not present

## 2023-03-29 DIAGNOSIS — Z6831 Body mass index (BMI) 31.0-31.9, adult: Secondary | ICD-10-CM | POA: Diagnosis not present

## 2023-03-29 DIAGNOSIS — E669 Obesity, unspecified: Secondary | ICD-10-CM | POA: Diagnosis not present

## 2023-03-29 DIAGNOSIS — E0590 Thyrotoxicosis, unspecified without thyrotoxic crisis or storm: Secondary | ICD-10-CM | POA: Diagnosis not present

## 2023-06-07 ENCOUNTER — Other Ambulatory Visit: Payer: Self-pay

## 2023-06-07 ENCOUNTER — Encounter: Payer: Self-pay | Admitting: Nurse Practitioner

## 2023-06-07 ENCOUNTER — Ambulatory Visit (INDEPENDENT_AMBULATORY_CARE_PROVIDER_SITE_OTHER): Payer: 59 | Admitting: Nurse Practitioner

## 2023-06-07 VITALS — BP 120/82 | HR 95 | Temp 98.0°F | Resp 16 | Ht 62.0 in | Wt 175.7 lb

## 2023-06-07 DIAGNOSIS — Z130 Encounter for screening for diseases of the blood and blood-forming organs and certain disorders involving the immune mechanism: Secondary | ICD-10-CM

## 2023-06-07 DIAGNOSIS — Z0001 Encounter for general adult medical examination with abnormal findings: Secondary | ICD-10-CM

## 2023-06-07 DIAGNOSIS — E66811 Obesity, class 1: Secondary | ICD-10-CM

## 2023-06-07 DIAGNOSIS — Z6833 Body mass index (BMI) 33.0-33.9, adult: Secondary | ICD-10-CM | POA: Diagnosis not present

## 2023-06-07 DIAGNOSIS — Z23 Encounter for immunization: Secondary | ICD-10-CM

## 2023-06-07 DIAGNOSIS — R7989 Other specified abnormal findings of blood chemistry: Secondary | ICD-10-CM

## 2023-06-07 DIAGNOSIS — R7303 Prediabetes: Secondary | ICD-10-CM

## 2023-06-07 DIAGNOSIS — Z0000 Encounter for general adult medical examination without abnormal findings: Secondary | ICD-10-CM

## 2023-06-07 DIAGNOSIS — E559 Vitamin D deficiency, unspecified: Secondary | ICD-10-CM | POA: Diagnosis not present

## 2023-06-07 DIAGNOSIS — E785 Hyperlipidemia, unspecified: Secondary | ICD-10-CM

## 2023-06-07 DIAGNOSIS — Z1211 Encounter for screening for malignant neoplasm of colon: Secondary | ICD-10-CM

## 2023-06-07 MED ORDER — PHENTERMINE HCL 37.5 MG PO TABS: 37.5000 mg | ORAL_TABLET | Freq: Every day | ORAL | 2 refills | Status: DC

## 2023-06-07 NOTE — Progress Notes (Signed)
Name: Pamela Nichols   MRN: [id number]    DOB: 03-16-1966   Date:06/07/2023       Progress Note  Subjective  Chief Complaint  Chief Complaint  Patient presents with   Annual Exam    HPI  Patient presents for annual CPE and chronic condition  Obesity:  patient weight today is 175 lbs with a BMI of 32.14. she would like help with weight loss.  Discussed options.  She would like to try phentermine for three months.  She will work on diet and exercise. Discussed side effects and how to take medication.  Co morbidities include prediabetes.   Diet: Regular Exercise: 2 days 30 minutes  Sleep: 6 hours Last dental exam:2023 Last eye exam: 2020  Health Maintenance  Topic Date Due   Zoster (Shingles) Vaccine (1 of 2) Never done   COVID-19 Vaccine (4 - 2024-25 season) 01/17/2023   Colon Cancer Screening  09/09/2023   Mammogram  01/04/2024   DTaP/Tdap/Td vaccine (2 - Td or Tdap) 02/11/2030   Flu Shot  Completed   Hepatitis C Screening  Completed   HIV Screening  Completed   HPV Vaccine  Aged [employer] Office Visit from 06/07/2023 in [facility]  AUDIT-C Score 0      Depression: Phq 9 is  negative    06/07/2023    2:30 PM 11/30/2022    2:46 PM 01/12/2022    1:33 PM 07/14/2021    2:03 PM 02/24/2021    1:09 PM  Depression screen PHQ 2/9  Decreased Interest 0 0 0 0 0  Down, Depressed, Hopeless 0 0 0 0 0  PHQ - 2 Score 0 0 0 0 0  Altered sleeping   1 0   Tired, decreased energy   1 0   Change in appetite   2 0   Feeling bad or failure about yourself    0 0   Trouble concentrating   0 0   Moving slowly or fidgety/restless   0 0   Suicidal thoughts   0 0   PHQ-9 Score   4 0   Difficult doing work/chores   Not difficult at all Not difficult at all    Hypertension: BP Readings from Last 3 Encounters:  06/07/23 120/82  11/30/22 122/74  06/08/22 115/82   Obesity: Wt Readings from Last 3 Encounters:  06/07/23 175 lb 11.2 oz (79.7 kg)   11/30/22 170 lb 6.4 oz (77.3 kg)  06/08/22 170 lb (77.1 kg)   BMI Readings from Last 3 Encounters:  06/07/23 32.14 kg/m  11/30/22 31.17 kg/m  06/08/22 31.09 kg/m     Vaccines:  HPV: up to at age 45 , ask insurance if age between 92-45  Shingrix: 85-64 yo and ask insurance if covered when patient above 47 yo Pneumonia:  educated and discussed with patient. Flu:  educated and discussed with patient.  Hep C Screening: completed STD testing and prevention (HIV/chl/gon/syphilis): completed Intimate partner violence:NO Menstrual History/LMP/Abnormal Bleeding: hysterectomy Incontinence Symptoms: none  Breast cancer:  - Last Mammogram: 01/04/2023 - BRCA gene screening: none  Osteoporosis: Discussed high calcium and vitamin D supplementation, weight bearing exercises  Cervical cancer screening: hysterectomy   Skin cancer: Discussed monitoring for atypical lesions  Colorectal cancer: 09/08/2013, due this year, she is going to get it schedule Lung cancer:   Low Dose CT Chest recommended if Age 69-80 years, 20 pack-year currently smoking OR have quit w/in 15years. Patient does not  qualify.   ECG: 12/02/2017  Advanced Care Planning: A voluntary discussion about advance care planning including the explanation and discussion of advance directives.  Discussed health care proxy and Living will, and the patient was able to identify a health care proxy as Pamela Nichols (husband).  Patient does not have a living will at present time. If patient does have living will, I have requested they bring this to the clinic to be scanned in to their chart.  Lipids: Lab Results  Component Value Date   CHOL 202 (H) 11/30/2022   CHOL 203 (H) 01/12/2022   CHOL 199 02/12/2020   Lab Results  Component Value Date   HDL 93 11/30/2022   HDL 75 01/12/2022   HDL 80 02/12/2020   Lab Results  Component Value Date   LDLCALC 93 11/30/2022   LDLCALC 111 (H) 01/12/2022   LDLCALC 100 (H) 02/12/2020   Lab  Results  Component Value Date   TRIG 69 11/30/2022   TRIG 78 01/12/2022   TRIG 95 02/12/2020   Lab Results  Component Value Date   CHOLHDL 2.2 11/30/2022   CHOLHDL 2.7 01/12/2022   CHOLHDL 2.5 02/12/2020   No results found for: "LDLDIRECT"  Glucose: Glucose, Bld  Date Value Ref Range Status  11/30/2022 80 65 - 99 mg/dL Final    Comment:    .            Fasting reference interval .   01/12/2022 77 65 - 99 mg/dL Final    Comment:    .            Fasting reference interval .   02/27/2021 61 (L) 65 - 99 mg/dL Final    Comment:    .            Fasting reference interval .     Patient Active Problem List   Diagnosis Date Noted   History of abnormal TSH 01/12/2022   Hyperlipidemia 01/12/2022   S/P hysterectomy 01/26/2018   Class 1 obesity without serious comorbidity with body mass index (BMI) of 33.0 to 33.9 in adult 06/25/2015   Constipation 07/10/2013   Vitamin D deficiency 07/10/2013    Past Surgical History:  Procedure Laterality Date   ABDOMINAL HYSTERECTOMY     SALPINGOOPHORECTOMY Bilateral 01/26/2018   Procedure: BILATERAL SALPINGO OOPHORECTOMY;  Surgeon: Lazaro Arms, MD;  Location: [facility];  Service: Gynecology;  Laterality: Bilateral;   SUPRACERVICAL ABDOMINAL HYSTERECTOMY N/A 01/26/2018   Procedure: HYSTERECTOMY SUPRACERVICAL ABDOMINAL;  Surgeon: Lazaro Arms, MD;  Location: [facility];  Service: Gynecology;  Laterality: N/A;   TUBAL LIGATION      Family History  Problem Relation Age of Onset   Hypertension Mother    Mental illness Mother        Depression   Cancer Father        prostate    Hypertension Sister    Hypertension Sister    Hypertension Sister    Breast cancer Neg Hx     Social History   Socioeconomic History   Marital status: Married    Spouse name: Not on file   Number of children: 2   Years of education: 14   Highest education level: Not on file  Occupational History   Occupation: Hair Stylist  Tobacco Use   Smoking  status: Never   Smokeless tobacco: Never  Vaping Use   Vaping status: Never Used  Substance and Sexual Activity   Alcohol use: No  Alcohol/week: 0.0 standard drinks of alcohol   Drug use: No   Sexual activity: Yes    Birth control/protection: Surgical, Post-menopausal  Other Topics Concern   Not on file  Social History Narrative   Aslee grew up in [place]. She is married and lives in [place] with her husband. She works as a Social worker. She and her husband are raising their nephew that is 56 years old. She has 2 adult children from a previous marriage. Her children live in [place]. She enjoys being outdoors. She also enjoys relaxing and watching TV.   Social Drivers of Health   Financial Resource Strain: Patient Declined (06/07/2023)   Overall Financial Resource Strain (CARDIA)    Difficulty of Paying Living Expenses: Patient declined  Food Insecurity: No Food Insecurity (06/07/2023)   Hunger Vital Sign    Worried About Running Out of Food in the Last Year: Never true    Ran Out of Food in the Last Year: Never true  Transportation Needs: No Transportation Needs (06/07/2023)   PRAPARE - Administrator, Civil Service (Medical): No    Lack of Transportation (Non-Medical): No  Physical Activity: Patient Declined (06/07/2023)   Exercise Vital Sign    Days of Exercise per Week: Patient declined    Minutes of Exercise per Session: Patient declined  Stress: Patient Declined (06/07/2023)   [employer] of Occupational Health - Occupational Stress Questionnaire    Feeling of Stress : Patient declined  Social Connections: Patient Declined (06/07/2023)   Social Connection and Isolation Panel [NHANES]    Frequency of Communication with Friends and Family: Patient declined    Frequency of Social Gatherings with Friends and Family: Patient declined    Attends Religious Services: Patient declined    Database administrator or Organizations: Patient declined     Attends Banker Meetings: Patient declined    Marital Status: Patient declined  Intimate Partner Violence: Not At Risk (06/07/2023)   Humiliation, Afraid, Rape, and Kick questionnaire    Fear of Current or Ex-Partner: No    Emotionally Abused: No    Physically Abused: No    Sexually Abused: No     Current Outpatient Medications:    Cholecalciferol (VITAMIN D3) 50 MCG (2000 UT) capsule, Take 1 capsule (2,000 Units total) by mouth daily., Disp: 90 capsule, Rfl: 3   phentermine (ADIPEX-P) 37.5 MG tablet, Take 1 tablet (37.5 mg total) by mouth daily before breakfast., Disp: 30 tablet, Rfl: 2  No Known Allergies   ROS  Constitutional: Negative for fever or weight change.  Respiratory: Negative for cough and shortness of breath.   Cardiovascular: Negative for chest pain or palpitations.  Gastrointestinal: Negative for abdominal pain, no bowel changes.  Musculoskeletal: Negative for gait problem or joint swelling.  Skin: Negative for rash.  Neurological: Negative for dizziness or headache.  No other specific complaints in a complete review of systems (except as listed in HPI above).   Objective  Vitals:   06/07/23 1426  BP: 120/82  Pulse: 95  Resp: 16  Temp: 98 F (36.7 C)  TempSrc: Oral  SpO2: 99%  Weight: 175 lb 11.2 oz (79.7 kg)  Height: 5\' 2"  (1.575 m)    Body mass index is 32.14 kg/m.  Physical Exam Vitals reviewed.  Constitutional:      Appearance: Normal appearance.  HENT:     Head: Normocephalic.     Right Ear: Tympanic membrane normal.     Left Ear: Tympanic membrane  normal.     Nose: Nose normal.  Eyes:     Extraocular Movements: Extraocular movements intact.     Conjunctiva/sclera: Conjunctivae normal.     Pupils: Pupils are equal, round, and reactive to light.  Neck:     Thyroid: No thyroid mass, thyromegaly or thyroid tenderness.  Cardiovascular:     Rate and Rhythm: Normal rate and regular rhythm.     Pulses: Normal pulses.     Heart  sounds: Normal heart sounds.  Pulmonary:     Effort: Pulmonary effort is normal.     Breath sounds: Normal breath sounds.  Abdominal:     General: Bowel sounds are normal.     Palpations: Abdomen is soft.  Musculoskeletal:        General: Normal range of motion.     Cervical back: Normal range of motion and neck supple.     Right lower leg: No edema.     Left lower leg: No edema.  Skin:    General: Skin is warm and dry.     Capillary Refill: Capillary refill takes less than 2 seconds.  Neurological:     General: No focal deficit present.     Mental Status: She is alert and oriented to person, place, and time. Mental status is at baseline.  Psychiatric:        Mood and Affect: Mood normal.        Behavior: Behavior normal.        Thought Content: Thought content normal.        Judgment: Judgment normal.      No results found for this or any previous visit (from the past 2160 hours).   Fall Risk:    06/07/2023    2:30 PM 11/30/2022    2:46 PM 01/12/2022    1:32 PM 07/14/2021    2:02 PM 02/24/2021    1:09 PM  Fall Risk   Falls in the past year? 0 0 0 0 0  Number falls in past yr: 0 0 0 0 0  Injury with Fall? 0 0 0 0 0  Risk for fall due to : Medication side effect      Follow up Falls evaluation completed  Falls evaluation completed Falls evaluation completed Falls evaluation completed     Functional Status Survey: Is the patient deaf or have difficulty hearing?: No Does the patient have difficulty seeing, even when wearing glasses/contacts?: No Does the patient have difficulty concentrating, remembering, or making decisions?: No Does the patient have difficulty walking or climbing stairs?: No Does the patient have difficulty dressing or bathing?: No Does the patient have difficulty doing errands alone such as visiting a doctor's office or shopping?: No   Assessment & Plan  1. Annual physical exam (Primary)  - CBC with Differential/Platelet - COMPLETE METABOLIC  PANEL WITH GFR - Lipid panel - TSH - Hemoglobin A1c - VITAMIN D 25 Hydroxy (Vit-D Deficiency, Fractures) - T4, free  2. Screening for colon cancer  - Ambulatory referral to Gastroenterology  3. Hyperlipidemia, unspecified hyperlipidemia type  - Lipid panel  4. Prediabetes  - COMPLETE METABOLIC PANEL WITH GFR - Hemoglobin A1c  5. Vitamin D deficiency  - VITAMIN D 25 Hydroxy (Vit-D Deficiency, Fractures)  6. Screening for deficiency anemia  - CBC with Differential/Platelet  7. History of abnormal TSH  - TSH - T4, free  8. Need for influenza vaccination  - Flu vaccine trivalent PF, 6mos and older(Flulaval,Afluria,Fluarix,Fluzone)  9. Class 1 obesity due  to excess calories without serious comorbidity with body mass index (BMI) of 33.0 to 33.9 in adult  - phentermine (ADIPEX-P) 37.5 MG tablet; Take 1 tablet (37.5 mg total) by mouth daily before breakfast.  Dispense: 30 tablet; Refill: 2  -USPSTF grade A and B recommendations reviewed with patient; age-appropriate recommendations, preventive care, screening tests, etc discussed and encouraged; healthy living encouraged; see AVS for patient education given to patient -Discussed importance of 150 minutes of physical activity weekly, eat two servings of fish weekly, eat one serving of tree nuts ( cashews, pistachios, pecans, almonds.Marland Kitchen) every other day, eat 6 servings of fruit/vegetables daily and drink plenty of water and avoid sweet beverages.   -Reviewed Health Maintenance: yes

## 2023-06-08 ENCOUNTER — Encounter: Payer: Self-pay | Admitting: Nurse Practitioner

## 2023-06-08 LAB — CBC WITH DIFFERENTIAL/PLATELET
Absolute Lymphocytes: 2232 {cells}/uL (ref 850–3900)
Absolute Monocytes: 314 {cells}/uL (ref 200–950)
Basophils Absolute: 30 {cells}/uL (ref 0–200)
Basophils Relative: 0.7 %
Eosinophils Absolute: 82 {cells}/uL (ref 15–500)
Eosinophils Relative: 1.9 %
HCT: 39 % (ref 35.0–45.0)
Hemoglobin: 12.3 g/dL (ref 11.7–15.5)
MCH: 27.8 pg (ref 27.0–33.0)
MCHC: 31.5 g/dL — ABNORMAL LOW (ref 32.0–36.0)
MCV: 88.2 fL (ref 80.0–100.0)
MPV: 10.4 fL (ref 7.5–12.5)
Monocytes Relative: 7.3 %
Neutro Abs: 1643 {cells}/uL (ref 1500–7800)
Neutrophils Relative %: 38.2 %
Platelets: 215 10*3/uL (ref 140–400)
RBC: 4.42 10*6/uL (ref 3.80–5.10)
RDW: 13 % (ref 11.0–15.0)
Total Lymphocyte: 51.9 %
WBC: 4.3 10*3/uL (ref 3.8–10.8)

## 2023-06-08 LAB — HEMOGLOBIN A1C
Hgb A1c MFr Bld: 5.6 %{Hb} (ref <5.7)
Mean Plasma Glucose: 114 mg/dL
eAG (mmol/L): 6.3 mmol/L

## 2023-06-08 LAB — COMPLETE METABOLIC PANEL WITH GFR
AG Ratio: 1.5 (calc) (ref 1.0–2.5)
ALT: 13 U/L (ref 6–29)
AST: 14 U/L (ref 10–35)
Albumin: 4.3 g/dL (ref 3.6–5.1)
Alkaline phosphatase (APISO): 89 U/L (ref 37–153)
BUN: 8 mg/dL (ref 7–25)
CO2: 27 mmol/L (ref 20–32)
Calcium: 9.4 mg/dL (ref 8.6–10.4)
Chloride: 106 mmol/L (ref 98–110)
Creat: 0.54 mg/dL (ref 0.50–1.03)
Globulin: 2.9 g/dL (ref 1.9–3.7)
Glucose, Bld: 85 mg/dL (ref 65–99)
Potassium: 4 mmol/L (ref 3.5–5.3)
Sodium: 141 mmol/L (ref 135–146)
Total Bilirubin: 0.4 mg/dL (ref 0.2–1.2)
Total Protein: 7.2 g/dL (ref 6.1–8.1)
eGFR: 106 mL/min/{1.73_m2} (ref >=60)

## 2023-06-08 LAB — LIPID PANEL
Cholesterol: 192 mg/dL (ref <200)
HDL: 80 mg/dL (ref >=50)
LDL Cholesterol (Calc): 97 mg/dL
Non-HDL Cholesterol (Calc): 112 mg/dL (ref <130)
Total CHOL/HDL Ratio: 2.4 (calc) (ref <5.0)
Triglycerides: 65 mg/dL (ref <150)

## 2023-06-08 LAB — VITAMIN D 25 HYDROXY (VIT D DEFICIENCY, FRACTURES): Vit D, 25-Hydroxy: 60 ng/mL (ref 30–100)

## 2023-06-08 LAB — T4, FREE: Free T4: 1 ng/dL (ref 0.8–1.8)

## 2023-06-08 LAB — TSH: TSH: 0.32 m[IU]/L — ABNORMAL LOW (ref 0.40–4.50)

## 2023-06-21 ENCOUNTER — Encounter: Payer: Self-pay | Admitting: *Deleted

## 2023-06-22 ENCOUNTER — Telehealth: Payer: Self-pay

## 2023-06-22 NOTE — Telephone Encounter (Signed)
 The patient called in to schedule her colonoscopy.

## 2023-06-28 ENCOUNTER — Other Ambulatory Visit: Payer: Self-pay | Admitting: *Deleted

## 2023-06-28 ENCOUNTER — Telehealth: Payer: Self-pay | Admitting: *Deleted

## 2023-06-28 DIAGNOSIS — Z1211 Encounter for screening for malignant neoplasm of colon: Secondary | ICD-10-CM

## 2023-06-28 MED ORDER — NA SULFATE-K SULFATE-MG SULF 17.5-3.13-1.6 GM/177ML PO SOLN: 1.0000 | Freq: Once | ORAL | 0 refills | Status: AC

## 2023-06-28 NOTE — Telephone Encounter (Signed)
 Gastroenterology Pre-Procedure Review  Request Date: 08/30/2023 Requesting Physician: Dr. Antony Baumgartner  PATIENT REVIEW QUESTIONS: The patient responded to the following health history questions as indicated:    1. Are you having any GI issues? no 2. Do you have a personal history of Polyps? no 3. Do you have a family history of Colon Cancer or Polyps? no 4. Diabetes Mellitus? no 5. Joint replacements in the past 12 months?no 6. Major health problems in the past 3 months?no 7. Any artificial heart valves, MVP, or defibrillator?no    MEDICATIONS & ALLERGIES:    Patient reports the following regarding taking any anticoagulation/antiplatelet therapy:   Plavix, Coumadin, Eliquis, Xarelto, Lovenox , Pradaxa, Brilinta, or Effient? no Aspirin? no  Patient confirms/reports the following medications:  Current Outpatient Medications  Medication Sig Dispense Refill   Cholecalciferol (VITAMIN D3) 50 MCG (2000 UT) capsule Take 1 capsule (2,000 Units total) by mouth daily. 90 capsule 3   phentermine  (ADIPEX-P ) 37.5 MG tablet Take 1 tablet (37.5 mg total) by mouth daily before breakfast. 30 tablet 2   No current facility-administered medications for this visit.    Patient confirms/reports the following allergies:  No Known Allergies  No orders of the defined types were placed in this encounter.   AUTHORIZATION INFORMATION Primary Insurance: 1D#: Group #:  Secondary Insurance: 1D#: Group #:  SCHEDULE INFORMATION: Date: 08/30/2023 Time: Location:  [facility]

## 2023-07-11 ENCOUNTER — Emergency Department (HOSPITAL_COMMUNITY): Payer: 59

## 2023-07-11 ENCOUNTER — Emergency Department (HOSPITAL_COMMUNITY)
Admission: EM | Admit: 2023-07-11 | Discharge: 2023-07-11 | Disposition: A | Discharge status: Home / Self Care | Payer: 59 | Attending: Emergency Medicine | Admitting: Emergency Medicine

## 2023-07-11 ENCOUNTER — Encounter (HOSPITAL_COMMUNITY): Payer: Self-pay

## 2023-07-11 ENCOUNTER — Other Ambulatory Visit: Payer: Self-pay

## 2023-07-11 DIAGNOSIS — R112 Nausea with vomiting, unspecified: Secondary | ICD-10-CM | POA: Diagnosis not present

## 2023-07-11 DIAGNOSIS — I6782 Cerebral ischemia: Secondary | ICD-10-CM | POA: Diagnosis not present

## 2023-07-11 DIAGNOSIS — R55 Syncope and collapse: Secondary | ICD-10-CM | POA: Insufficient documentation

## 2023-07-11 DIAGNOSIS — R42 Dizziness and giddiness: Secondary | ICD-10-CM | POA: Insufficient documentation

## 2023-07-11 LAB — COMPREHENSIVE METABOLIC PANEL
ALT: 21 U/L (ref 0–44)
AST: 20 U/L (ref 15–41)
Albumin: 3.8 g/dL (ref 3.5–5.0)
Alkaline Phosphatase: 71 U/L (ref 38–126)
Anion gap: 11 (ref 5–15)
BUN: 11 mg/dL (ref 6–20)
CO2: 22 mmol/L (ref 22–32)
Calcium: 8.9 mg/dL (ref 8.9–10.3)
Chloride: 105 mmol/L (ref 98–111)
Creatinine, Ser: 0.53 mg/dL (ref 0.44–1.00)
GFR, Estimated: >60 mL/min (ref >60)
Glucose, Bld: 119 mg/dL — ABNORMAL HIGH (ref 70–99)
Potassium: 4.3 mmol/L (ref 3.5–5.1)
Sodium: 138 mmol/L (ref 135–145)
Total Bilirubin: 0.6 mg/dL (ref 0.0–1.2)
Total Protein: 6.8 g/dL (ref 6.5–8.1)

## 2023-07-11 LAB — CBC
HCT: 36.2 % (ref 36.0–46.0)
Hemoglobin: 11.7 g/dL — ABNORMAL LOW (ref 12.0–15.0)
MCH: 28.8 pg (ref 26.0–34.0)
MCHC: 32.3 g/dL (ref 30.0–36.0)
MCV: 89.2 fL (ref 80.0–100.0)
Platelets: 195 10*3/uL (ref 150–400)
RBC: 4.06 MIL/uL (ref 3.87–5.11)
RDW: 13.2 % (ref 11.5–15.5)
WBC: 5.1 10*3/uL (ref 4.0–10.5)
nRBC: 0 % (ref 0.0–0.2)

## 2023-07-11 LAB — LIPASE, BLOOD: Lipase: 44 U/L (ref 11–51)

## 2023-07-11 LAB — TROPONIN I (HIGH SENSITIVITY): Troponin I (High Sensitivity): <2 ng/L (ref <18)

## 2023-07-11 MED ORDER — ONDANSETRON HCL 4 MG/2ML IJ SOLN
4.0000 mg | Freq: Once | INTRAMUSCULAR | Status: AC
  Administered 2023-07-11: 4 mg via INTRAVENOUS
  Filled 2023-07-11: qty 2

## 2023-07-11 MED ORDER — MECLIZINE HCL 25 MG PO TABS: 25.0000 mg | ORAL_TABLET | Freq: Three times a day (TID) | ORAL | 0 refills | Status: DC | PRN

## 2023-07-11 MED ORDER — DIAZEPAM 5 MG/ML IJ SOLN
5.0000 mg | Freq: Once | INTRAMUSCULAR | Status: AC
  Administered 2023-07-11: 5 mg via INTRAVENOUS
  Filled 2023-07-11: qty 2

## 2023-07-11 MED ORDER — SODIUM CHLORIDE 0.9 % IV BOLUS
1000.0000 mL | Freq: Once | INTRAVENOUS | Status: AC
  Administered 2023-07-11: 1000 mL via INTRAVENOUS

## 2023-07-11 MED ORDER — MECLIZINE HCL 12.5 MG PO TABS
25.0000 mg | ORAL_TABLET | Freq: Once | ORAL | Status: AC
  Administered 2023-07-11: 25 mg via ORAL
  Filled 2023-07-11: qty 2

## 2023-07-11 NOTE — ED Provider Notes (Signed)
 [place] EMERGENCY DEPARTMENT AT [facility] Provider Note   CSN: [id number] Arrival date & time: 07/11/23  1340     History  Chief Complaint  Patient presents with   Emesis    Pamela Nichols is a 58 y.o. female.  Patient is a 58 year old female who presents to the emergency department with a chief complaint of dizziness, nausea and vomiting which began approximately 8 hours prior to arrival.  Patient notes that she has been unable to tolerate any p.o. intake today.  She notes that the dizziness did start before the nausea and vomiting.  She notes that the dizziness is worse with turning her head to the right.  She denies any history of similar symptoms in the past.  She denies any associated headaches, pain to neck or back.  She denies any abdominal pain.  She denies any associated numbness, paresthesias or unilateral weakness.  She denies any associated syncopal events.   Emesis      Home Medications Prior to Admission medications   Medication Sig Start Date End Date Taking? Authorizing Provider  meclizine (ANTIVERT) 25 MG tablet Take 1 tablet (25 mg total) by mouth 3 (three) times daily as needed for dizziness. 07/11/23  Yes Lelon Perla, PA-C  Cholecalciferol (VITAMIN D3) 50 MCG (2000 UT) capsule Take 1 capsule (2,000 Units total) by mouth daily. 01/13/22   Berniece Salines, FNP  phentermine (ADIPEX-P) 37.5 MG tablet Take 1 tablet (37.5 mg total) by mouth daily before breakfast. 06/07/23   Berniece Salines, FNP      Allergies    Patient has no known allergies.    Review of Systems   Review of Systems  Gastrointestinal:  Positive for vomiting.  Neurological:  Positive for dizziness.  All other systems reviewed and are negative.   Physical Exam Updated Vital Signs BP 124/84   Pulse 86   Temp 98 F (36.7 C) (Oral)   Resp 19   LMP 04/02/2014   SpO2 100%  Physical Exam Vitals reviewed.  Constitutional:      Appearance: Normal appearance.   HENT:     Head: Normocephalic and atraumatic.     Nose: Nose normal.     Mouth/Throat:     Mouth: Mucous membranes are moist.  Eyes:     Extraocular Movements: Extraocular movements intact.     Conjunctiva/sclera: Conjunctivae normal.     Pupils: Pupils are equal, round, and reactive to light.  Cardiovascular:     Rate and Rhythm: Normal rate and regular rhythm.     Pulses: Normal pulses.     Heart sounds: Normal heart sounds. No murmur heard.    No gallop.  Pulmonary:     Effort: Pulmonary effort is normal. No respiratory distress.     Breath sounds: Normal breath sounds. No stridor. No wheezing or rales.  Abdominal:     General: Abdomen is flat. Bowel sounds are normal. There is no distension.     Palpations: Abdomen is soft.     Tenderness: There is no abdominal tenderness. There is no guarding.  Musculoskeletal:        General: No swelling. Normal range of motion.     Cervical back: Normal range of motion and neck supple.  Skin:    General: Skin is warm and dry.  Neurological:     General: No focal deficit present.     Mental Status: She is alert and oriented to person, place, and time. Mental status is at baseline.  Cranial Nerves: No cranial nerve deficit.     Sensory: No sensory deficit.     Motor: No weakness.     Gait: Gait normal.     Comments: Finger-nose intact, normal rapid alternating movements, normal heel-to-shin  Psychiatric:        Mood and Affect: Mood normal.        Behavior: Behavior normal.        Thought Content: Thought content normal.        Judgment: Judgment normal.     ED Results / Procedures / Treatments   Labs (all labs ordered are listed, but only abnormal results are displayed) Labs Reviewed  COMPREHENSIVE METABOLIC PANEL - Abnormal; Notable for the following components:      Result Value   Glucose, Bld 119 (*)    All other components within normal limits  CBC - Abnormal; Notable for the following components:   Hemoglobin 11.7  (*)    All other components within normal limits  LIPASE, BLOOD  TROPONIN I (HIGH SENSITIVITY)    EKG None  Radiology MR BRAIN WO CONTRAST Result Date: 07/11/2023 CLINICAL DATA:  Syncope/presyncope, cerebrovascular cause suspected EXAM: MRI HEAD WITHOUT CONTRAST TECHNIQUE: Multiplanar, multiecho pulse sequences of the brain and surrounding structures were obtained without intravenous contrast. COMPARISON:  Head CT 06/08/2022 FINDINGS: Brain: Negative for an acute infarct. No hemorrhage. No hydrocephalus. No extra-axial fluid collection. No mass effect. No mass lesion. There is a background of mild chronic microvascular ischemic change. Vascular: Normal flow voids. Skull and upper cervical spine: Normal marrow signal. Sinuses/Orbits: No middle ear or mastoid effusion. Paranasal sinuses are clear. Orbits are unremarkable. Other: None. IMPRESSION: No acute intracranial process. Electronically Signed   By: Lorenza Cambridge M.D.   On: 07/11/2023 18:27    Procedures Procedures    Medications Ordered in ED Medications  meclizine (ANTIVERT) tablet 25 mg (25 mg Oral Given 07/11/23 1432)  sodium chloride 0.9 % bolus 1,000 mL (0 mLs Intravenous Stopped 07/11/23 1624)  ondansetron (ZOFRAN) injection 4 mg (4 mg Intravenous Given 07/11/23 1432)  diazepam (VALIUM) injection 5 mg (5 mg Intravenous Given 07/11/23 1610)    ED Course/ Medical Decision Making/ A&P                                 Medical Decision Making Amount and/or Complexity of Data Reviewed Labs: ordered. Radiology: ordered.  Risk Prescription drug management.   This patient presents to the ED for concern of dizziness differential diagnosis includes CVA, TIA, vertigo, labyrinthitis, Mnire's disease, ACS, meningitis, cranial mass   Additional history obtained:  Additional history obtained from husband External records from outside source obtained and reviewed including none   Lab Tests:  I Ordered, and personally  interpreted labs.  The pertinent results include: No changes in kidney function, electrolytes, liver function, mild anemia, negative troponin   Imaging Studies ordered:  I ordered imaging studies including MRI brain I independently visualized and interpreted imaging which showed no acute ischemic process I agree with the radiologist interpretation   Medicines ordered and prescription drug management:  I ordered medication including meclizine, Valium for vertigo Reevaluation of the patient after these medicines showed that the patient improved I have reviewed the patients home medicines and have made adjustments as needed   Problem List / ED Course:  Patient is doing well at this time and is stable for discharge home.  Symptoms have completely resolved with  treatment in the emergency department and patient has been able to ambulate in the emergency department without difficulty and without assistance.  Patient had an MRI of the brain which demonstrated no signs of acute ischemic change and do not suspect CVA or TIA at this point.  Suspect benign positional vertigo at this point.  Will continue treatment with meclizine at home and recommend close follow-up with ENT.  Patient had no concerning neurological deficits noted on exam.  EKG demonstrated no acute ischemic changes and she had negative troponin.  Do not suspect ACS at this time.  Patient has a benign abdominal exam and do not suspect an acute intra-abdominal surgical pathology at this point.  Strict return precautions were provided for any new or worsening symptoms.  Patient voiced understanding to the plan and had no additional questions.   Social Determinants of Health:  None           Final Clinical Impression(s) / ED Diagnoses Final diagnoses:  Vertigo    Rx / DC Orders ED Discharge Orders          Ordered    meclizine (ANTIVERT) 25 MG tablet  3 times daily PRN        07/11/23 1841              Lelon Perla, PA-C 07/11/23 1846    Bethann Berkshire, MD 07/18/23 1727

## 2023-07-11 NOTE — Discharge Instructions (Signed)
 Please follow-up closely with your primary care doctor and ENT on an outpatient basis.  Return to the emergency department immediately for any new or worsening symptoms.

## 2023-07-11 NOTE — ED Notes (Signed)
 Pt ambulated around nurses station and stated that she was hungry. Patient ambulated well and stated she was not as dizzy or light headed as she was earlier.

## 2023-07-11 NOTE — ED Triage Notes (Signed)
 Pt states she woke up around 0300 with n/v/d. Been going on all day. Unable to hold anything down. Denies any fevers.

## 2023-07-12 ENCOUNTER — Other Ambulatory Visit: Payer: Self-pay | Admitting: Nurse Practitioner

## 2023-07-12 DIAGNOSIS — R42 Dizziness and giddiness: Secondary | ICD-10-CM

## 2023-07-12 NOTE — Telephone Encounter (Signed)
 Copied from CRM [id number]. Topic: Referral - Request for Referral >> Jul 12, 2023 12:49 PM Kristie Cowman wrote: Did the patient discuss referral with their provider in the last year? No Appointment offered? Yes, she wants Della Goo to call her regarding this referral.  Patient was seen in the ER with vertigo over the weekend.   Type of order/referral and detailed reason for visit: ENT  Preference of office, provider, location: [facility]  If referral order, have you been seen by this specialty before? No (If Yes, this issue or another issue? When? Where?  Can we respond through MyChart? No

## 2023-07-12 NOTE — Telephone Encounter (Signed)
 Pt.notified

## 2023-07-12 NOTE — Telephone Encounter (Signed)
Can we place referral ?

## 2023-07-21 DIAGNOSIS — H8111 Benign paroxysmal vertigo, right ear: Secondary | ICD-10-CM | POA: Diagnosis not present

## 2023-08-06 ENCOUNTER — Ambulatory Visit: Payer: Self-pay

## 2023-08-06 NOTE — Telephone Encounter (Signed)
 Chief Complaint: toe blister, pain Symptoms: blister between her 4th and 5th toe on the R foot Frequency: since Christmas but getting more painful Pertinent Negatives: Patient denies redness, skin color change, pus, drainage, multiple blisters, signs of infection,  fever Disposition: [] ED /[] Urgent Care (no appt availability in office) / [x] Appointment(In office/virtual)/ []  [facility]/ [] Home Care/ [] Refused Recommended Disposition /[] [facility]/ []  Follow-up with PCP Additional Notes: Pt reports she has had a blister between her 4th and 5th toes since Christmas. States it started after getting a pedicure. Pt not sure if it is a nail fungus. Pt states it is hard and yellow. Pt states she tried to cut it off and was able to remove some of it. Pt states it is becoming more painful, making walking painful. Pt works has a Scientist, research (medical), is on her feet often, still able to walk. Pt states her 4th and 5th toes now look slightly swollen as well. No swelling elsewhere. No drainage, pus, signs of infection, no redness, no skin color change, no fever. Per protocol RN scheduled pt for Monday at 1000, pt agreeable to that plan. RN advised pt if she gets worse before then to go to UC or the ED and she verbalized understanding.      Copied from CRM [id number]. Topic: Clinical - Red Word Triage >> Aug 06, 2023  4:49 PM Priscille Loveless wrote: Red Word that prompted transfer to Nurse Triage: pt has a nail fungus between toes that has a blister on it. She stated that it is very painful. Reason for Disposition  [1] Cause unknown AND [2] no new blisters  Answer Assessment - Initial Assessment Questions 1. APPEARANCE of BLISTER: "What does it look like?"     "It's like a blister, I went to the nail salon and got my feet done, had it since Xmas. It's gotten bigger, I can't hardly walk." "It is between my pinky toe and the other toe." - R foot  2. SIZE: "How large is the blister?" (inches, cm or compare  to coins)     Pea-sized 3. LOCATION: "Where are the blisters located?"      Between pinky toe and the toe next to it - R 4. WHEN: "When did the blister happen?"     Since Christmas 5. CAUSE: "What do you think caused the blister?"     Pedicure  6. PAIN: "Does it hurt?" If Yes, ask: "How bad is the pain?"  (Scale 1-10; or mild, moderate, severe)     8/10 7. OTHER SYMPTOMS: "Do you have any other symptoms?" (e.g., fever)     No drainage or pus. States it is hard. She tried to cut it off with toenail clippers. States she removed some of it but it's painful. 8/10 pain. Denies redness. Noticed swelling in those two toes in the last 2-3 days. Skin on the toes looks normal. Denies fever. Pt still able to walk but it hurts. Works as a Scientist, research (medical).  Protocols used: Blister - Foot and Hand-A-AH

## 2023-08-09 ENCOUNTER — Ambulatory Visit: Admitting: Internal Medicine

## 2023-08-23 ENCOUNTER — Encounter: Payer: Self-pay | Admitting: Gastroenterology

## 2023-08-30 ENCOUNTER — Encounter
Admission: RE | Disposition: A | Discharge status: Home / Self Care | Payer: Self-pay | Source: Ambulatory Visit | Attending: Gastroenterology

## 2023-08-30 ENCOUNTER — Ambulatory Visit
Admission: RE | Admit: 2023-08-30 | Discharge: 2023-08-30 | Disposition: A | Discharge status: Home / Self Care | Payer: 59 | Source: Ambulatory Visit | Attending: Gastroenterology | Admitting: Gastroenterology

## 2023-08-30 ENCOUNTER — Encounter: Payer: Self-pay | Admitting: Gastroenterology

## 2023-08-30 ENCOUNTER — Ambulatory Visit: Admitting: General Practice

## 2023-08-30 ENCOUNTER — Telehealth: Admitting: Nurse Practitioner

## 2023-08-30 ENCOUNTER — Ambulatory Visit: Payer: Self-pay

## 2023-08-30 DIAGNOSIS — Z1211 Encounter for screening for malignant neoplasm of colon: Secondary | ICD-10-CM

## 2023-08-30 DIAGNOSIS — J069 Acute upper respiratory infection, unspecified: Secondary | ICD-10-CM

## 2023-08-30 DIAGNOSIS — K219 Gastro-esophageal reflux disease without esophagitis: Secondary | ICD-10-CM | POA: Insufficient documentation

## 2023-08-30 DIAGNOSIS — I739 Peripheral vascular disease, unspecified: Secondary | ICD-10-CM | POA: Diagnosis not present

## 2023-08-30 SURGERY — COLONOSCOPY WITH PROPOFOL
Anesthesia: General

## 2023-08-30 MED ORDER — PREDNISONE 20 MG PO TABS
20.0000 mg | ORAL_TABLET | Freq: Every day | ORAL | 0 refills | Status: AC
Start: 2023-08-30 — End: 2023-09-04

## 2023-08-30 MED ORDER — BENZONATATE 100 MG PO CAPS: 100.0000 mg | ORAL_CAPSULE | Freq: Three times a day (TID) | ORAL | 0 refills | Status: DC | PRN

## 2023-08-30 MED ORDER — PROPOFOL 10 MG/ML IV BOLUS
INTRAVENOUS | Status: DC | PRN
  Administered 2023-08-30: 60 mg via INTRAVENOUS
  Administered 2023-08-30: 20 mg via INTRAVENOUS

## 2023-08-30 MED ORDER — IPRATROPIUM BROMIDE 0.03 % NA SOLN: 2.0000 | Freq: Two times a day (BID) | NASAL | 12 refills | Status: DC

## 2023-08-30 MED ORDER — SODIUM CHLORIDE 0.9 % IV SOLN
INTRAVENOUS | Status: DC
Start: 2023-08-30 — End: 2023-08-30
  Administered 2023-08-30: 20 mL/h via INTRAVENOUS

## 2023-08-30 MED ORDER — PROPOFOL 1000 MG/100ML IV EMUL
INTRAVENOUS | Status: AC
  Filled 2023-08-30: qty 500

## 2023-08-30 MED ORDER — PROPOFOL 500 MG/50ML IV EMUL
INTRAVENOUS | Status: DC | PRN
  Administered 2023-08-30: 165 ug/kg/min via INTRAVENOUS

## 2023-08-30 MED ORDER — LIDOCAINE HCL (CARDIAC) PF 100 MG/5ML IV SOSY
PREFILLED_SYRINGE | INTRAVENOUS | Status: DC | PRN
  Administered 2023-08-30: 100 mg via INTRAVENOUS

## 2023-08-30 NOTE — H&P (Signed)
 [name], MD [address], [address], [place], [place], [address] [address], [place], [place], [address] Phone: [phone]  Fax: [phone]  Primary Care Physician:  Berniece Salines, FNP   Pre-Procedure History & Physical: HPI:  Pamela Nichols is a 58 y.o. female is here for an colonoscopy.   Past Medical History:  Diagnosis Date   Arthritis    R shoulder   Chicken pox    GERD (gastroesophageal reflux disease)    Pericardial effusion 11/23/2017   Vitamin D deficiency     Past Surgical History:  Procedure Laterality Date   ABDOMINAL HYSTERECTOMY     SALPINGOOPHORECTOMY Bilateral 01/26/2018   Procedure: BILATERAL SALPINGO OOPHORECTOMY;  Surgeon: Lazaro Arms, MD;  Location: [facility];  Service: Gynecology;  Laterality: Bilateral;   SUPRACERVICAL ABDOMINAL HYSTERECTOMY N/A 01/26/2018   Procedure: HYSTERECTOMY SUPRACERVICAL ABDOMINAL;  Surgeon: Lazaro Arms, MD;  Location: [facility];  Service: Gynecology;  Laterality: N/A;   TUBAL LIGATION      Prior to Admission medications   Medication Sig Start Date End Date Taking? Authorizing Provider  Cholecalciferol (VITAMIN D3) 50 MCG (2000 UT) capsule Take 1 capsule (2,000 Units total) by mouth daily. 01/13/22  Yes Berniece Salines, FNP  meclizine (ANTIVERT) 25 MG tablet Take 1 tablet (25 mg total) by mouth 3 (three) times daily as needed for dizziness. 07/11/23  Yes Lelon Perla, PA-C  phentermine (ADIPEX-P) 37.5 MG tablet Take 1 tablet (37.5 mg total) by mouth daily before breakfast. 06/07/23  Yes Berniece Salines, FNP    Allergies as of 06/28/2023   (No Known Allergies)    Family History  Problem Relation Age of Onset   Hypertension Mother    Mental illness Mother        Depression   Cancer Father        prostate    Hypertension Sister    Hypertension Sister    Hypertension Sister    Breast cancer Neg Hx     Social History   Socioeconomic History   Marital status: Married    Spouse  name: Not on file   Number of children: 2   Years of education: 14   Highest education level: Not on file  Occupational History   Occupation: Hair Stylist  Tobacco Use   Smoking status: Never   Smokeless tobacco: Never  Vaping Use   Vaping status: Never Used  Substance and Sexual Activity   Alcohol use: No    Alcohol/week: 0.0 standard drinks of alcohol   Drug use: No   Sexual activity: Yes    Birth control/protection: Surgical, Post-menopausal  Other Topics Concern   Not on file  Social History Narrative   Pamela Nichols grew up in [place]. She is married and lives in [place] with her husband. She works as a Social worker. She and her husband are raising their nephew that is 72 years old. She has 2 adult children from a previous marriage. Her children live in [place]. She enjoys being outdoors. She also enjoys relaxing and watching TV.   Social Drivers of Health   Financial Resource Strain: Patient Declined (06/07/2023)   Overall Financial Resource Strain (CARDIA)    Difficulty of Paying Living Expenses: Patient declined  Food Insecurity: No Food Insecurity (06/07/2023)   Hunger Vital Sign    Worried About Running Out of Food in the Last Year: Never true    Ran Out of Food in the Last Year: Never  true  Transportation Needs: No Transportation Needs (06/07/2023)   PRAPARE - Administrator, Civil Service (Medical): No    Lack of Transportation (Non-Medical): No  Physical Activity: Patient Declined (06/07/2023)   Exercise Vital Sign    Days of Exercise per Week: Patient declined    Minutes of Exercise per Session: Patient declined  Stress: Patient Declined (06/07/2023)   [employer] of Occupational Health - Occupational Stress Questionnaire    Feeling of Stress : Patient declined  Social Connections: Patient Declined (06/07/2023)   Social Connection and Isolation Panel [NHANES]    Frequency of Communication with Friends and Family: Patient declined     Frequency of Social Gatherings with Friends and Family: Patient declined    Attends Religious Services: Patient declined    Database administrator or Organizations: Patient declined    Attends Banker Meetings: Patient declined    Marital Status: Patient declined  Intimate Partner Violence: Not At Risk (06/07/2023)   Humiliation, Afraid, Rape, and Kick questionnaire    Fear of Current or Ex-Partner: No    Emotionally Abused: No    Physically Abused: No    Sexually Abused: No    Review of Systems: See HPI, otherwise negative ROS  Physical Exam: BP (!) 123/91   Pulse 87   Temp (!) 96.6 F (35.9 C) (Temporal)   Resp 20   Ht 5\' 2"  (1.575 m)   Wt 80.7 kg   LMP 04/02/2014   SpO2 97%   BMI 32.56 kg/m  General:   Alert,  pleasant and cooperative in NAD Head:  Normocephalic and atraumatic. Neck:  Supple; no masses or thyromegaly. Lungs:  Clear throughout to auscultation, normal respiratory effort.    Heart:  +S1, +S2, Regular rate and rhythm, No edema. Abdomen:  Soft, nontender and nondistended. Normal bowel sounds, without guarding, and without rebound.   Neurologic:  Alert and  oriented x4;  grossly normal neurologically.  Impression/Plan: Edwin Bellmore is here for an colonoscopy to be performed for Screening colonoscopy average risk   Risks, benefits, limitations, and alternatives regarding  colonoscopy have been reviewed with the patient.  Questions have been answered.  All parties agreeable.   Luke Salaam, MD  08/30/2023, 8:03 AM

## 2023-08-30 NOTE — Op Note (Signed)
 [facility]astroenterology Patient Name: Pamela Nichols Procedure Date: 08/30/2023 7:19 AM MRN: [id number] Account #: [id number] Date of Birth: [birth year] Admit Type: Outpatient Age: 58 Room: [facility] ENDO ROOM 1 Gender: Female Note Status: Finalized Instrument Name: Charlyn Cooley [id number] Procedure:             Colonoscopy Indications:           Screening for colorectal malignant neoplasm Providers:             Luke Salaam MD, MD Referring MD:          Monalisa Angles. Abram Hoguet (Referring MD) Medicines:             Monitored Anesthesia Care Complications:         No immediate complications. Procedure:             Pre-Anesthesia Assessment:                        - Prior to the procedure, a History and Physical was                         performed, and patient medications, allergies and                         sensitivities were reviewed. The patient's tolerance                         of previous anesthesia was reviewed.                        - The risks and benefits of the procedure and the                         sedation options and risks were discussed with the                         patient. All questions were answered and informed                         consent was obtained.                        - ASA Grade Assessment: II - A patient with mild                         systemic disease.                        After obtaining informed consent, the colonoscope was                         passed under direct vision. Throughout the procedure,                         the patient's blood pressure, pulse, and oxygen                         saturations were monitored continuously. The                         Colonoscope was introduced  through the anus and                         advanced to the the cecum, identified by the                         appendiceal orifice. The colonoscopy was performed                         with ease. The patient tolerated the procedure well.                          The quality of the bowel preparation was excellent.                         The ileocecal valve, appendiceal orifice, and rectum                         were photographed. Findings:      The perianal and digital rectal examinations were normal.      The entire examined colon appeared normal on direct and retroflexion       views. Impression:            - The entire examined colon is normal on direct and                         retroflexion views.                        - No specimens collected. Recommendation:        - Discharge patient to home (with escort).                        - Resume previous diet.                        - Continue present medications.                        - Repeat colonoscopy in 10 years for screening                         purposes. Procedure Code(s):     --- Professional ---                        [phone], Colonoscopy, flexible; diagnostic, including                         collection of specimen(s) by brushing or washing, when                         performed (separate procedure) Diagnosis Code(s):     --- Professional ---                        Z12.11, Encounter for screening for malignant neoplasm                         of colon CPT copyright 2022 American Medical Association. All rights reserved. The codes documented in this report  are preliminary and upon coder review may  be revised to meet current compliance requirements. Luke Salaam, MD Luke Salaam MD, MD 08/30/2023 8:22:46 AM This report has been signed electronically. Number of Addenda: 0 Note Initiated On: 08/30/2023 7:19 AM Scope Withdrawal Time: 0 hours 7 minutes 33 seconds  Total Procedure Duration: 0 hours 11 minutes 57 seconds  Estimated Blood Loss:  Estimated blood loss: none.      [facility]

## 2023-08-30 NOTE — Progress Notes (Signed)
 Virtual Visit Consent   Pamela Nichols, you are scheduled for a virtual visit with a [facility] provider today. Just as with appointments in the office, your consent must be obtained to participate. Your consent will be active for this visit and any virtual visit you may have with one of our providers in the next 365 days. If you have a MyChart account, a copy of this consent can be sent to you electronically.  As this is a virtual visit, video technology does not allow for your provider to perform a traditional examination. This may limit your provider's ability to fully assess your condition. If your provider identifies any concerns that need to be evaluated in person or the need to arrange testing (such as labs, EKG, etc.), we will make arrangements to do so. Although advances in technology are sophisticated, we cannot ensure that it will always work on either your end or our end. If the connection with a video visit is poor, the visit may have to be switched to a telephone visit. With either a video or telephone visit, we are not always able to ensure that we have a secure connection.  By engaging in this virtual visit, you consent to the provision of healthcare and authorize for your insurance to be billed (if applicable) for the services provided during this visit. Depending on your insurance coverage, you may receive a charge related to this service.  I need to obtain your verbal consent now. Are you willing to proceed with your visit today? Pamela Nichols has provided verbal consent on 08/30/2023 for a virtual visit (video or telephone). Pamela Shake, FNP  Date: 08/30/2023 6:58 PM   Virtual Visit via Video Note   I, Pamela Nichols, connected with  Pamela Nichols  ([id number], June 28, 1965) on 08/30/23 at  7:00 PM EDT by a video-enabled telemedicine application and verified that I am speaking with the correct person using two identifiers.  Location: Patient: Virtual Visit Location  Patient: Home Provider: Virtual Visit Location Provider: Home Office   I discussed the limitations of evaluation and management by telemedicine and the availability of in person appointments. The patient expressed understanding and agreed to proceed.    History of Present Illness: Pamela Nichols is a 58 y.o. who identifies as a female who was assigned female at birth, and is being seen today for cough and congestion.   She has more coughing when she lays down at night  Her cough is mostly dry but may be productive at time   Initially had a runny nose that has resolved  Started 5 days ago with flu like symptoms body aches/fever that resolved   Denies a history of asthma   Ongoing symptom is tickle and cough that is worse at night    Problems:  Patient Active Problem List   Diagnosis Date Noted   History of abnormal TSH 01/12/2022   Hyperlipidemia 01/12/2022   S/P hysterectomy 01/26/2018   Class 1 obesity without serious comorbidity with body mass index (BMI) of 33.0 to 33.9 in adult 06/25/2015   Constipation 07/10/2013   Screening for colon cancer 07/10/2013   Vitamin D deficiency 07/10/2013    Allergies: No Known Allergies Medications:  Current Outpatient Medications:    Cholecalciferol (VITAMIN D3) 50 MCG (2000 UT) capsule, Take 1 capsule (2,000 Units total) by mouth daily., Disp: 90 capsule, Rfl: 3   meclizine (ANTIVERT) 25 MG tablet, Take 1 tablet (25 mg total) by mouth 3 (three) times daily as needed for dizziness.,  Disp: 21 tablet, Rfl: 0   phentermine (ADIPEX-P) 37.5 MG tablet, Take 1 tablet (37.5 mg total) by mouth daily before breakfast., Disp: 30 tablet, Rfl: 2  Observations/Objective: Patient is well-developed, well-nourished in no acute distress.  Resting comfortably  at home.  Head is normocephalic, atraumatic.  No labored breathing.  Speech is clear and coherent with logical content.  Patient is alert and oriented at baseline.    Assessment and  Plan:  1. Viral upper respiratory tract infection  Seems residual from flu without signs of secondary infection  Advised to watch for change in cough/mucous production/ fever return wheezing or new concerns   Meds ordered this encounter  Medications   ipratropium (ATROVENT) 0.03 % nasal spray    Sig: Place 2 sprays into both nostrils every 12 (twelve) hours.    Dispense:  30 mL    Refill:  12   benzonatate (TESSALON) 100 MG capsule    Sig: Take 1 capsule (100 mg total) by mouth 3 (three) times daily as needed.    Dispense:  30 capsule    Refill:  0   predniSONE (DELTASONE) 20 MG tablet    Sig: Take 1 tablet (20 mg total) by mouth daily with breakfast for 5 days.    Dispense:  5 tablet    Refill:  0    Follow Up Instructions: I discussed the assessment and treatment plan with the patient. The patient was provided an opportunity to ask questions and all were answered. The patient agreed with the plan and demonstrated an understanding of the instructions.  A copy of instructions were sent to the patient via MyChart unless otherwise noted below.    The patient was advised to call back or seek an in-person evaluation if the symptoms worsen or if the condition fails to improve as anticipated.    Pamela Shake, FNP

## 2023-08-30 NOTE — Anesthesia Procedure Notes (Signed)
 Procedure Name: General with mask airway Date/Time: 08/30/2023 8:12 AM  Performed by: Niki Barter, CRNAPre-anesthesia Checklist: Emergency Drugs available, Patient identified, Suction available and Patient being monitored Patient Re-evaluated:Patient Re-evaluated prior to induction Oxygen Delivery Method: Simple face mask Induction Type: IV induction Placement Confirmation: positive ETCO2 and breath sounds checked- equal and bilateral Dental Injury: Teeth and Oropharynx as per pre-operative assessment

## 2023-08-30 NOTE — Anesthesia Preprocedure Evaluation (Signed)
 Anesthesia Evaluation  Patient identified by MRN, date of birth, ID band Patient awake    Reviewed: Allergy & Precautions, NPO status , Patient's Chart, lab work & pertinent test results  Airway Mallampati: II  TM Distance: >3 FB Neck ROM: Full    Dental no notable dental hx. (+) Teeth Intact   Pulmonary neg pulmonary ROS   Pulmonary exam normal breath sounds clear to auscultation       Cardiovascular Exercise Tolerance: Good + Peripheral Vascular Disease  negative cardio ROS Normal cardiovascular examI Rhythm:Regular Rate:Normal     Neuro/Psych negative neurological ROS  negative psych ROS   GI/Hepatic negative GI ROS, Neg liver ROS,GERD  Medicated and Controlled,,Only PRN meds-none in about 7 days  Denies any Sx today   Endo/Other  negative endocrine ROS    Renal/GU      Musculoskeletal   Abdominal   Peds  Hematology negative hematology ROS (+)   Anesthesia Other Findings   Reproductive/Obstetrics negative OB ROS                             Anesthesia Physical Anesthesia Plan  ASA: 2  Anesthesia Plan: General   Post-op Pain Management: Minimal or no pain anticipated   Induction: Intravenous  PONV Risk Score and Plan: 3 and Propofol infusion, TIVA and Ondansetron  Airway Management Planned: Nasal Cannula  Additional Equipment: None  Intra-op Plan:   Post-operative Plan:   Informed Consent: I have reviewed the patients History and Physical, chart, labs and discussed the procedure including the risks, benefits and alternatives for the proposed anesthesia with the patient or authorized representative who has indicated his/her understanding and acceptance.     Dental advisory given  Plan Discussed with: CRNA and Surgeon  Anesthesia Plan Comments: (Discussed risks of anesthesia with patient, including possibility of difficulty with spontaneous ventilation under  anesthesia necessitating airway intervention, PONV, and rare risks such as cardiac or respiratory or neurological events, and allergic reactions. Discussed the role of CRNA in patient's perioperative care. Patient understands.)       Anesthesia Quick Evaluation

## 2023-08-30 NOTE — Anesthesia Postprocedure Evaluation (Signed)
 Anesthesia Post Note  Patient: Technical sales engineer  Procedure(s) Performed: COLONOSCOPY WITH PROPOFOL  Patient location during evaluation: Endoscopy Anesthesia Type: General Level of consciousness: awake and alert Pain management: pain level controlled Vital Signs Assessment: post-procedure vital signs reviewed and stable Respiratory status: spontaneous breathing, nonlabored ventilation, respiratory function stable and patient connected to nasal cannula oxygen Cardiovascular status: blood pressure returned to baseline and stable Postop Assessment: no apparent nausea or vomiting Anesthetic complications: no  No notable events documented.   Last Vitals:  Vitals:   08/30/23 0823 08/30/23 0824  BP:    Pulse:    Resp:    Temp: (!) 35.8 C (!) 35.8 C  SpO2:      Last Pain:  Vitals:   08/30/23 0824  TempSrc: Temporal  PainSc: 0-No pain                 Enrique Harvest

## 2023-08-30 NOTE — Transfer of Care (Signed)
 Immediate Anesthesia Transfer of Care Note  Patient: Pamela Nichols  Procedure(s) Performed: COLONOSCOPY WITH PROPOFOL  Patient Location: Endoscopy Unit  Anesthesia Type:General  Level of Consciousness: drowsy and patient cooperative  Airway & Oxygen Therapy: Patient Spontanous Breathing and Patient connected to face mask oxygen  Post-op Assessment: Report given to RN and Post -op Vital signs reviewed and stable  Post vital signs: Reviewed and stable  Last Vitals:  Vitals Value Taken Time  BP    Temp 35.8 C 08/30/23 0823  Pulse    Resp    SpO2      Last Pain:  Vitals:   08/30/23 0823  TempSrc: Temporal  PainSc: 0-No pain         Complications: No notable events documented.

## 2023-08-30 NOTE — Telephone Encounter (Signed)
 Chief Complaint: cough Symptoms: cough, congestion Frequency: since last Thursday Pertinent Negatives: Patient denies fever, wheezing, SOB, CP Disposition: [] ED /[x] Urgent Care (no appt availability in office) / [] Appointment(In office/virtual)/ []  [facility]/ [] Home Care/ [] Refused Recommended Disposition /[] [facility]/ []  Follow-up with PCP Additional Notes: Pt reports cough that is sometimes productive. Pt states she got sick last Thursday with cough, congestion, and a fever. Pt states most of all her symptoms have resolved aside from her cough. Pt reports cough is worse lying down at night. Pt denies wheezing, SOB, or CP. Denies hemoptysis. Pt would like a medication called in for cough so she can go back to work tomorrow. Pt also had a colonoscopy this AM. Pt states she would be unable to come into the office this week because she is busy. RN scheduled pt for a virtual UC visit tonight at 1900. Pt agreeable to that plan. RN advised pt if she develops SOB or CP to go to the ED and for other worsening she should give [place] a call. Pt verbalized understanding.     Copied from CRM [id number]. Topic: Clinical - Medication Question >> Aug 30, 2023  3:55 PM Santiya F wrote: Reason for CRM: Patient is calling in requesting for her provider to send in some medication for a cold. Please follow up with patient. Reason for Disposition  SEVERE coughing spells (e.g., whooping sound after coughing, vomiting after coughing)  Answer Assessment - Initial Assessment Questions 1. ONSET: "When did the cough begin?"      "It started last Thursday when it rained and I was sick all weekend", "started feeling better Sunday" 2. SEVERITY: "How bad is the cough today?"      "On a scale from 1-10, it's probably a 5, states it is worse with lying down" 3. SPUTUM: "Describe the color of your sputum" (none, dry cough; clear, white, yellow, green)     "Not as yellow as it was, it's thick mucus" 4.  HEMOPTYSIS: "Are you coughing up any blood?" If so ask: "How much?" (flecks, streaks, tablespoons, etc.)     None 5. DIFFICULTY BREATHING: "Are you having difficulty breathing?" If Yes, ask: "How bad is it?" (e.g., mild, moderate, severe)    - MILD: No SOB at rest, mild SOB with walking, speaks normally in sentences, can lie down, no retractions, pulse < 100.    - MODERATE: SOB at rest, SOB with minimal exertion and prefers to sit, cannot lie down flat, speaks in phrases, mild retractions, audible wheezing, pulse 100-120.    - SEVERE: Very SOB at rest, speaks in single words, struggling to breathe, sitting hunched forward, retractions, pulse > 120      None 6. FEVER: "Do you have a fever?" If Yes, ask: "What is your temperature, how was it measured, and when did it start?"     Had a fever last week, since resolved 7. CARDIAC HISTORY: "Do you have any history of heart disease?" (e.g., heart attack, congestive heart failure)      No 8. LUNG HISTORY: "Do you have any history of lung disease?"  (e.g., pulmonary embolus, asthma, emphysema)     no 9. PE RISK FACTORS: "Do you have a history of blood clots?" (or: recent major surgery, recent prolonged travel, bedridden)     No 10. OTHER SYMPTOMS: "Do you have any other symptoms?" (e.g., runny nose, wheezing, chest pain)       Colonoscopy performed this AM. Endorses runny nose Friday, went away. Denies  SOB, just a nagging cough. No CP. Mucinex, tylenol, theraflu, nyquil, dayquil - took all of those medications  Protocols used: Cough - Acute Productive-A-AH

## 2024-01-19 ENCOUNTER — Ambulatory Visit: Admitting: Nurse Practitioner

## 2024-01-19 ENCOUNTER — Encounter: Payer: Self-pay | Admitting: Nurse Practitioner

## 2024-01-19 VITALS — BP 116/72 | HR 98 | Temp 98.6°F | Resp 18 | Ht 62.0 in | Wt 185.1 lb

## 2024-01-19 DIAGNOSIS — R42 Dizziness and giddiness: Secondary | ICD-10-CM | POA: Diagnosis not present

## 2024-01-19 DIAGNOSIS — R55 Syncope and collapse: Secondary | ICD-10-CM

## 2024-01-19 MED ORDER — MECLIZINE HCL 25 MG PO TABS: 25.0000 mg | ORAL_TABLET | Freq: Three times a day (TID) | ORAL | 0 refills | Status: DC | PRN

## 2024-01-19 NOTE — Progress Notes (Signed)
 BP 116/72   Pulse 98   Temp 98.6 F (37 C)   Resp 18   Ht 5' 2 (1.575 m)   Wt 185 lb 1.6 oz (84 kg)   LMP 04/02/2014   SpO2 99%   BMI 33.86 kg/m    Subjective:    Patient ID: Pamela Nichols, female    DOB: June 27, 1965, 58 y.o.   MRN: [id number]  HPI: Pamela Nichols is a 58 y.o. female  Chief Complaint  Patient presents with   Loss of Consciousness    Fainting had an episode last week in am at work had nothing to eat but cup of coffee and was witness, she states this happened once also in [place].  Pt has been having some dizziness and headaches   Discussed the use of AI scribe software for clinical note transcription with the patient, who gave verbal consent to proceed.  History of Present Illness Pamela Nichols is a 57 year old female who presents with dizziness, headaches, and fainting spells.  Syncope and presyncope - Fainting spells occurred in January 2025 and again last week - Most recent episode occurred in the morning at work after consuming only a cup of coffee and no food - Syncope was witnessed; regained consciousness shortly after with the help of a wet towel and drinking water - Lightheadedness and dizziness preceded fainting - Occasional lightheadedness when standing  Dizziness and vertigo - Persistent dizziness and headaches - Vertigo episode in February 2025, evaluated in the emergency department - Dizziness worsens with head turns while driving  Headache - Ongoing headaches associated with dizziness and fainting spells  Relationship to food intake - Symptoms often occur when skipping breakfast or not eating in the morning - Feels sick around 1 or 2 PM if breakfast is skipped - Usually eats in the mornings but occasionally skips meals, which correlates with symptoms  Emergency department evaluation and diagnostic testing - Evaluated in the emergency department on July 11, 2023, for vertigo - Brain MRI on July 11, 2023, was  normal - EKG showed normal sinus rhythm at a rate of 90 - CBC, CMP, A1c, and TSH were performed (results not specified) - Discharged with a prescription for meclizine , which she has just started taking  Associated symptoms - No chest pain - No shortness of breath     EXAM: MRI HEAD WITHOUT CONTRAST   TECHNIQUE: Multiplanar, multiecho pulse sequences of the brain and surrounding structures were obtained without intravenous contrast.   COMPARISON:  Head CT 06/08/2022   FINDINGS: Brain: Negative for an acute infarct. No hemorrhage. No hydrocephalus. No extra-axial fluid collection. No mass effect. No mass lesion. There is a background of mild chronic microvascular ischemic change.   Vascular: Normal flow voids.   Skull and upper cervical spine: Normal marrow signal.   Sinuses/Orbits: No middle ear or mastoid effusion. Paranasal sinuses are clear. Orbits are unremarkable.   Other: None.   IMPRESSION: No acute intracranial process.    01/19/2024    2:22 PM 06/07/2023    2:30 PM 11/30/2022    2:46 PM  Depression screen PHQ 2/9  Decreased Interest 0 0 0  Down, Depressed, Hopeless 0 0 0  PHQ - 2 Score 0 0 0  Altered sleeping 0    Tired, decreased energy 0    Change in appetite 0    Feeling bad or failure about yourself  0    Trouble concentrating 0    Moving slowly or fidgety/restless 0  Suicidal thoughts 0    PHQ-9 Score 0    Difficult doing work/chores Not difficult at all      Relevant past medical, surgical, family and social history reviewed and updated as indicated. Interim medical history since our last visit reviewed. Allergies and medications reviewed and updated.  Review of Systems  Ten systems reviewed and is negative except as mentioned in HPI      Objective:      BP 116/72   Pulse 98   Temp 98.6 F (37 C)   Resp 18   Ht 5' 2 (1.575 m)   Wt 185 lb 1.6 oz (84 kg)   LMP 04/02/2014   SpO2 99%   BMI 33.86 kg/m    Wt Readings from Last 3  Encounters:  01/19/24 185 lb 1.6 oz (84 kg)  08/30/23 178 lb (80.7 kg)  06/07/23 175 lb 11.2 oz (79.7 kg)    Physical Exam VITALS: P- 103, BP- 116/72 GENERAL: Alert, cooperative, well developed, no acute distress HEENT: Normocephalic, normal oropharynx, moist mucous membranes CHEST: Clear to auscultation bilaterally, No wheezes, rhonchi, or crackles CARDIOVASCULAR: Normal heart rate and rhythm, S1 and S2 normal without murmurs ABDOMEN: Soft, non-tender, non-distended, without organomegaly, Normal bowel sounds EXTREMITIES: No cyanosis or edema NEUROLOGICAL: Cranial nerves grossly intact, Moves all extremities without gross motor or sensory deficit, Neurological exam normal  Results for orders placed or performed during the hospital encounter of 07/11/23  Lipase, blood   Collection Time: 07/11/23  3:29 PM  Result Value Ref Range   Lipase 44 11 - 51 U/L  Comprehensive metabolic panel   Collection Time: 07/11/23  3:29 PM  Result Value Ref Range   Sodium 138 135 - 145 mmol/L   Potassium 4.3 3.5 - 5.1 mmol/L   Chloride 105 98 - 111 mmol/L   CO2 22 22 - 32 mmol/L   Glucose, Bld 119 (H) 70 - 99 mg/dL   BUN 11 6 - 20 mg/dL   Creatinine, Ser 9.46 0.44 - 1.00 mg/dL   Calcium 8.9 8.9 - 89.6 mg/dL   Total Protein 6.8 6.5 - 8.1 g/dL   Albumin 3.8 3.5 - 5.0 g/dL   AST 20 15 - 41 U/L   ALT 21 0 - 44 U/L   Alkaline Phosphatase 71 38 - 126 U/L   Total Bilirubin 0.6 0.0 - 1.2 mg/dL   GFR, Estimated >39 >39 mL/min   Anion gap 11 5 - 15  CBC   Collection Time: 07/11/23  3:29 PM  Result Value Ref Range   WBC 5.1 4.0 - 10.5 K/uL   RBC 4.06 3.87 - 5.11 MIL/uL   Hemoglobin 11.7 (L) 12.0 - 15.0 g/dL   HCT 63.7 63.9 - 53.9 %   MCV 89.2 80.0 - 100.0 fL   MCH 28.8 26.0 - 34.0 pg   MCHC 32.3 30.0 - 36.0 g/dL   RDW 86.7 88.4 - 84.4 %   Platelets 195 150 - 400 K/uL   nRBC 0.0 0.0 - 0.2 %  Troponin I (High Sensitivity)   Collection Time: 07/11/23  3:31 PM  Result Value Ref Range   Troponin I  (High Sensitivity) <2 <18 ng/L          Assessment & Plan:   Problem List Items Addressed This Visit   None Visit Diagnoses       Syncope, unspecified syncope type    -  Primary   Relevant Medications   meclizine  (ANTIVERT ) 25 MG tablet   Other  Relevant Orders   EKG 12-Lead   CBC with Differential/Platelet   Comprehensive metabolic panel with GFR   Hemoglobin A1c   TSH   Ambulatory referral to Neurology     Vertigo       Relevant Orders   Ambulatory referral to Neurology        Assessment and Plan Assessment & Plan Recurrent syncope and vertigo Recurrent episodes of syncope and vertigo, with the most recent episode occurring last week. The episode was associated with dizziness and lightheadedness, occurring in the morning after consuming only coffee and no food. Previous MRI of the brain was normal. EKG shows normal sinus rhythm. Neurological examination is normal. No signs of seizure activity. Meclizine  has been prescribed for vertigo and is being used as needed. - Order CBC, CMP, A1c, and TSH to evaluate for underlying causes of syncope and vertigo. - Ensure she eats regularly to prevent hypoglycemia and potential syncope. - Continue meclizine  as needed for vertigo. - Refer to a neurologist for further evaluation of recurrent vertigo despite normal MRI.        Follow up plan: Return if symptoms worsen or fail to improve.

## 2024-01-20 ENCOUNTER — Other Ambulatory Visit: Payer: Self-pay | Admitting: Nurse Practitioner

## 2024-01-20 ENCOUNTER — Ambulatory Visit: Payer: Self-pay | Admitting: Nurse Practitioner

## 2024-01-20 DIAGNOSIS — R42 Dizziness and giddiness: Secondary | ICD-10-CM

## 2024-01-20 DIAGNOSIS — R55 Syncope and collapse: Secondary | ICD-10-CM

## 2024-01-21 LAB — CBC WITH DIFFERENTIAL/PLATELET
Absolute Lymphocytes: 2662 {cells}/uL (ref 850–3900)
Absolute Monocytes: 331 {cells}/uL (ref 200–950)
Basophils Absolute: 29 {cells}/uL (ref 0–200)
Basophils Relative: 0.5 %
Eosinophils Absolute: 200 {cells}/uL (ref 15–500)
Eosinophils Relative: 3.5 %
HCT: 28.7 % — ABNORMAL LOW (ref 35.0–45.0)
Hemoglobin: 9 g/dL — ABNORMAL LOW (ref 11.7–15.5)
MCH: 28.5 pg (ref 27.0–33.0)
MCHC: 31.4 g/dL — ABNORMAL LOW (ref 32.0–36.0)
MCV: 90.8 fL (ref 80.0–100.0)
MPV: 10.4 fL (ref 7.5–12.5)
Monocytes Relative: 5.8 %
Neutro Abs: 2480 {cells}/uL (ref 1500–7800)
Neutrophils Relative %: 43.5 %
Platelets: 220 Thousand/uL (ref 140–400)
RBC: 3.16 Million/uL — ABNORMAL LOW (ref 3.80–5.10)
RDW: 13.2 % (ref 11.0–15.0)
Total Lymphocyte: 46.7 %
WBC: 5.7 Thousand/uL (ref 3.8–10.8)

## 2024-01-21 LAB — T3, FREE: T3, Free: 3.4 pg/mL (ref 2.3–4.2)

## 2024-01-21 LAB — IRON,TIBC AND FERRITIN PANEL
%SAT: 12 % — ABNORMAL LOW (ref 16–45)
Ferritin: 21 ng/mL (ref 16–232)
Iron: 35 ug/dL — ABNORMAL LOW (ref 45–160)
TIBC: 291 ug/dL (ref 250–450)

## 2024-01-21 LAB — HEMOGLOBIN A1C
Hgb A1c MFr Bld: 5.6 % (ref <5.7)
Mean Plasma Glucose: 114 mg/dL
eAG (mmol/L): 6.3 mmol/L

## 2024-01-21 LAB — TSH: TSH: 0.38 m[IU]/L — ABNORMAL LOW (ref 0.40–4.50)

## 2024-01-21 LAB — COMPREHENSIVE METABOLIC PANEL WITH GFR
AG Ratio: 1.6 (calc) (ref 1.0–2.5)
ALT: 15 U/L (ref 6–29)
AST: 16 U/L (ref 10–35)
Albumin: 4.1 g/dL (ref 3.6–5.1)
Alkaline phosphatase (APISO): 67 U/L (ref 37–153)
BUN: 11 mg/dL (ref 7–25)
CO2: 28 mmol/L (ref 20–32)
Calcium: 9 mg/dL (ref 8.6–10.4)
Chloride: 106 mmol/L (ref 98–110)
Creat: 0.67 mg/dL (ref 0.50–1.05)
Globulin: 2.5 g/dL (ref 1.9–3.7)
Glucose, Bld: 111 mg/dL — ABNORMAL HIGH (ref 65–99)
Potassium: 3.7 mmol/L (ref 3.5–5.3)
Sodium: 142 mmol/L (ref 135–146)
Total Bilirubin: 0.3 mg/dL (ref 0.2–1.2)
Total Protein: 6.6 g/dL (ref 6.1–8.1)
eGFR: 100 mL/min/1.73m2 (ref >=60)

## 2024-01-21 LAB — T4, FREE: Free T4: 1.1 ng/dL (ref 0.8–1.8)

## 2024-01-21 LAB — TEST AUTHORIZATION

## 2024-02-03 ENCOUNTER — Ambulatory Visit: Payer: Self-pay

## 2024-02-03 NOTE — Telephone Encounter (Signed)
 Increase in diet or try iron supplement 3 days a week?

## 2024-02-03 NOTE — Telephone Encounter (Signed)
 FYI Only or Action Required?: Action required by provider: clinical question for provider.  Patient was last seen in primary care on 01/19/2024 by Gareth Mliss FALCON, FNP.  Called Nurse Triage reporting Headache.  Symptoms began several weeks ago.  Interventions attempted: OTC medications: iron supplements.  Symptoms are: unchanged.  Triage Disposition: Call PCP Now  Patient/caregiver understands and will follow disposition?: Yes      Copied from CRM [phone]. Topic: Clinical - Medication Question >> Feb 03, 2024  2:13 PM Pamela Nichols wrote: Reason for CRM: Patient states at her last appointment was told to take iron. Said she took for 3 days and each time she felt nauseated and got a headache. Is wondering if there is another way to get iron without taking the supplement.  Patient can be reached at [phone] Reason for Disposition  [1] Caller has URGENT medicine question about med that primary care doctor (or NP/PA) or specialist prescribed AND [2] triager unable to answer question  Answer Assessment - Initial Assessment Questions 1. LOCATION: Where does it hurt?      *No Answer* 2. ONSET: When did the headache start? (e.g., minutes, hours, days)      X 1 week 3. PATTERN: Does the pain come and go, or has it been constant since it started?     Comes and go Takes excedrin migraine that provides relief 4. SEVERITY: How bad is the pain? and What does it keep you from doing?  (e.g., Scale 1-10; mild, moderate, or severe)     6-7/10 Excedrin sometimes relieves sx completely 5. RECURRENT SYMPTOM: Have you ever had headaches before? If Yes, ask: When was the last time? and What happened that time?      Yes, endorses has become more frequent lately 6. CAUSE: What do you think is causing the headache?     unknown 7. MIGRAINE: Have you been diagnosed with migraine headaches? If Yes, ask: Is this headache similar?      *No Answer* 8. HEAD INJURY: Has there been  any recent injury to your head?      denies 9. OTHER SYMPTOMS: Do you have any other symptoms? (e.g., fever, stiff neck, eye pain, sore throat, cold symptoms)     Denies Does endorse feeling dehydrated and constipation 10. PREGNANCY: Is there any chance you are pregnant? When was your last menstrual period?       N/a    Pt reports feeling worse/feeling different since being on iron pills. Pt endorses more frequent headaches (that is usually relieved by Excedrin), constipation, and nausea.   Triager will forward encounter for Mliss, NP 's office to review and advise. Patient verbalized understanding and is expecting call back from office for next steps.  Answer Assessment - Initial Assessment Questions 1. NAME of MEDICINE: What medicine(s) are you calling about?     Iron supplement 2. QUESTION: What is your question? (e.g., double dose of medicine, side effect)     Reports has been feeling different since taking iron pills 3. PRESCRIBER: Who prescribed the medicine? Reason: if prescribed by specialist, call should be referred to that group.     PCP 4. SYMPTOMS: Do you have any symptoms? If Yes, ask: What symptoms are you having?  How bad are the symptoms (e.g., mild, moderate, severe)     H/A, nausea, constipation 5. PREGNANCY:  Is there any chance that you are pregnant? When was your last menstrual period?     N/a  Protocols used: Headache-A-AH, Medication Question Call-A-AH

## 2024-02-04 ENCOUNTER — Other Ambulatory Visit: Payer: Self-pay | Admitting: Nurse Practitioner

## 2024-02-04 NOTE — Telephone Encounter (Signed)
 Pt.notified

## 2024-03-27 DIAGNOSIS — R519 Headache, unspecified: Secondary | ICD-10-CM | POA: Diagnosis not present

## 2024-03-27 DIAGNOSIS — Z1331 Encounter for screening for depression: Secondary | ICD-10-CM | POA: Diagnosis not present

## 2024-03-27 DIAGNOSIS — R42 Dizziness and giddiness: Secondary | ICD-10-CM | POA: Diagnosis not present

## 2024-03-27 DIAGNOSIS — R55 Syncope and collapse: Secondary | ICD-10-CM | POA: Diagnosis not present

## 2024-06-12 ENCOUNTER — Encounter: Payer: Self-pay | Admitting: Nurse Practitioner

## 2024-06-13 ENCOUNTER — Encounter: Payer: Self-pay | Admitting: Nurse Practitioner

## 2024-06-13 ENCOUNTER — Ambulatory Visit: Admitting: Nurse Practitioner

## 2024-06-13 ENCOUNTER — Encounter: Admitting: Nurse Practitioner

## 2024-06-13 VITALS — BP 116/86 | HR 84 | Temp 98.0°F | Ht 62.0 in | Wt 180.0 lb

## 2024-06-13 DIAGNOSIS — Z1231 Encounter for screening mammogram for malignant neoplasm of breast: Secondary | ICD-10-CM | POA: Diagnosis not present

## 2024-06-13 DIAGNOSIS — R7989 Other specified abnormal findings of blood chemistry: Secondary | ICD-10-CM | POA: Diagnosis not present

## 2024-06-13 DIAGNOSIS — E785 Hyperlipidemia, unspecified: Secondary | ICD-10-CM

## 2024-06-13 DIAGNOSIS — R42 Dizziness and giddiness: Secondary | ICD-10-CM | POA: Diagnosis not present

## 2024-06-13 DIAGNOSIS — Z6833 Body mass index (BMI) 33.0-33.9, adult: Secondary | ICD-10-CM

## 2024-06-13 DIAGNOSIS — E66811 Obesity, class 1: Secondary | ICD-10-CM | POA: Diagnosis not present

## 2024-06-13 DIAGNOSIS — E611 Iron deficiency: Secondary | ICD-10-CM | POA: Diagnosis not present

## 2024-06-13 DIAGNOSIS — Z0000 Encounter for general adult medical examination without abnormal findings: Secondary | ICD-10-CM | POA: Diagnosis not present

## 2024-06-13 DIAGNOSIS — R7303 Prediabetes: Secondary | ICD-10-CM | POA: Diagnosis not present

## 2024-06-13 DIAGNOSIS — Z130 Encounter for screening for diseases of the blood and blood-forming organs and certain disorders involving the immune mechanism: Secondary | ICD-10-CM

## 2024-06-13 MED ORDER — MECLIZINE HCL 25 MG PO TABS: 25.0000 mg | ORAL_TABLET | Freq: Three times a day (TID) | ORAL | 0 refills | Status: AC | PRN

## 2024-06-13 MED ORDER — PHENTERMINE HCL 37.5 MG PO TABS: 37.5000 mg | ORAL_TABLET | Freq: Every day | ORAL | 2 refills | Status: AC

## 2024-06-13 NOTE — Patient Instructions (Signed)
 Magnesium glycinate

## 2024-06-13 NOTE — Progress Notes (Signed)
 Name: Pamela Nichols   MRN: [id number]    DOB: 10-Dec-1965   Date:06/13/2024       Progress Note  Subjective  Chief Complaint  Chief Complaint  Patient presents with   Annual Exam    Pt would like to discuss weight loss medication. States it was previously prescribed but she never picked it up. Will schedule nurse visit for shingles vaccine.     HPI  Patient presents for annual CPE. Discussed the use of AI scribe software for clinical note transcription with the patient, who gave verbal consent to proceed.  History of Present Illness Pamela Nichols is a 59 year old female who presents for an annual physical exam.  Hyperlipidemia and prediabetes - History of hyperlipidemia. - Last A1c was 5.6; previous results have been in the prediabetic range.  Iron deficiency - Currently taking iron supplementation. - Iron panel has improved but remains low; most recent iron level was 35.  Thyroid  dysfunction - Abnormal TSH levels. - Last TSH was 0.38. - Did not return for recommended recheck.  Weight management - Interested in weight loss. - Previously prescribed phentermine  in 2017. - Actively working on lifestyle modifications, aiming for a calorie deficit of 1600 to 1800 calories per day. - Started exercising with a trainer at the beginning of the year. - Difficulty achieving weight loss, describing it as a 'hard battle.'  Vertigo - Experiences occasional vertigo. - Uses meclizine  as needed and requires a refill.  Urinary frequency - Frequent urges to urinate, attributed to weight.  Sleep disturbance - Difficulty sleeping, often wakes up and unable to fall asleep until midnight despite going to bed early. - Attempts to get at least six hours of sleep per night.  Preventive care - No issues with vision. - Has not had a dental exam in over a year and a half.    Diet: well balanced diet,  eating in a caloric deficit  Exercise: daily, saturated in January   Sleep: 6  hours Last dental exam: due, will schedule Last eye exam: years ago  [employer] Visit from 06/13/2024 in [facility]  AUDIT-C Score 0   Depression: Phq 9 is  negative    06/13/2024    9:58 AM 01/19/2024    2:22 PM 06/07/2023    2:30 PM 11/30/2022    2:46 PM 01/12/2022    1:33 PM  Depression screen PHQ 2/9  Decreased Interest 0 0 0 0 0  Down, Depressed, Hopeless 0 0 0 0 0  PHQ - 2 Score 0 0 0 0 0  Altered sleeping  0   1  Tired, decreased energy  0   1  Change in appetite  0   2  Feeling bad or failure about yourself   0   0  Trouble concentrating  0   0  Moving slowly or fidgety/restless  0   0  Suicidal thoughts  0   0  PHQ-9 Score  0    4   Difficult doing work/chores  Not difficult at all   Not difficult at all     Data saved with a previous flowsheet row definition   Hypertension: BP Readings from Last 3 Encounters:  06/13/24 116/86  01/19/24 116/72  08/30/23 (!) 129/94   Obesity: Wt Readings from Last 3 Encounters:  06/13/24 180 lb (81.6 kg)  01/19/24 185 lb 1.6 oz (84 kg)  08/30/23 178 lb (80.7 kg)   BMI Readings from Last 3  Encounters:  06/13/24 32.92 kg/m  01/19/24 33.86 kg/m  08/30/23 32.56 kg/m    Flowsheet Row Office Visit from 06/13/2024 in [facility]  1 39 inches     Vaccines:  HPV: up to at age 35 , ask insurance if age between 23-45  Shingrix: 58-64 yo and ask insurance if covered when patient above 6 yo Pneumonia:  educated and discussed with patient. Flu:  educated and discussed with patient.  Hep C Screening: completed STD testing and prevention (HIV/chl/gon/syphilis): completed Intimate partner violence:none Sexual History : sexually active Menstrual History/LMP/Abnormal Bleeding: hysterectomy Incontinence Symptoms: none  Breast cancer:  - Last Mammogram: 01/04/2023 - BRCA gene screening: none  Osteoporosis: Discussed high calcium and vitamin D  supplementation, weight  bearing exercises  Cervical cancer screening: hysterectomy  Skin cancer: Discussed monitoring for atypical lesions  Colorectal cancer: 08/30/2023   Lung cancer:   Low Dose CT Chest recommended if Age 69-80 years, 20 pack-year currently smoking OR have quit w/in 15years. Patient does not qualify.   ECG: 01/19/2024  Advanced Care Planning: A voluntary discussion about advance care planning including the explanation and discussion of advance directives.  Discussed health care proxy and Living will, and the patient was able to identify a health care proxy as daughter.  Patient does not have a living will at present time. If patient does have living will, I have requested they bring this to the clinic to be scanned in to their chart.  Lipids: Lab Results  Component Value Date   CHOL 192 06/07/2023   CHOL 202 (H) 11/30/2022   CHOL 203 (H) 01/12/2022   Lab Results  Component Value Date   HDL 80 06/07/2023   HDL 93 11/30/2022   HDL 75 01/12/2022   Lab Results  Component Value Date   LDLCALC 97 06/07/2023   LDLCALC 93 11/30/2022   LDLCALC 111 (H) 01/12/2022   Lab Results  Component Value Date   TRIG 65 06/07/2023   TRIG 69 11/30/2022   TRIG 78 01/12/2022   Lab Results  Component Value Date   CHOLHDL 2.4 06/07/2023   CHOLHDL 2.2 11/30/2022   CHOLHDL 2.7 01/12/2022   No results found for: LDLDIRECT  Glucose: Glucose, Bld  Date Value Ref Range Status  01/19/2024 111 (H) 65 - 99 mg/dL Final    Comment:    .            Fasting reference interval . For someone without known diabetes, a glucose value between 100 and 125 mg/dL is consistent with prediabetes and should be confirmed with a follow-up test. .   07/11/2023 119 (H) 70 - 99 mg/dL Final    Comment:    Glucose reference range applies only to samples taken after fasting for at least 8 hours.  06/07/2023 85 65 - 99 mg/dL Final    Comment:    .            Fasting reference interval .     Patient Active Problem  List   Diagnosis Date Noted   Vertigo 06/13/2024   History of abnormal TSH 01/12/2022   Hyperlipidemia 01/12/2022   S/P hysterectomy 01/26/2018   Class 1 obesity without serious comorbidity with body mass index (BMI) of 33.0 to 33.9 in adult 06/25/2015   Constipation 07/10/2013   Screening for colon cancer 07/10/2013   Vitamin D  deficiency 07/10/2013    Past Surgical History:  Procedure Laterality Date   ABDOMINAL HYSTERECTOMY     COLONOSCOPY WITH  PROPOFOL  N/A 08/30/2023   Procedure: COLONOSCOPY WITH PROPOFOL ;  Surgeon: Therisa Bi, MD;  Location[facility];  Service: Gastroenterology;  Laterality: N/A;   SALPINGOOPHORECTOMY Bilateral 01/26/2018   Procedure: BILATERAL SALPINGO OOPHORECTOMY;  Surgeon: Jayne Vonn DEL, MD;  Location: [facility] ORS;  Service: Gynecology;  Laterality: Bilateral;   SUPRACERVICAL ABDOMINAL HYSTERECTOMY N/A 01/26/2018   Procedure: HYSTERECTOMY SUPRACERVICAL ABDOMINAL;  Surgeon: Jayne Vonn DEL, MD;  Location: [facility];  Service: Gynecology;  Laterality: N/A;   TUBAL LIGATION      Family History  Problem Relation Age of Onset   Hypertension Mother    Mental illness Mother        Depression   Cancer Father        prostate    Hypertension Sister    Hypertension Sister    Hypertension Sister    Breast cancer Neg Hx     Social History   Socioeconomic History   Marital status: Married    Spouse name: Not on file   Number of children: 2   Years of education: 14   Highest education level: Not on file  Occupational History   Occupation: Hair Stylist  Tobacco Use   Smoking status: Never   Smokeless tobacco: Never  Vaping Use   Vaping status: Never Used  Substance and Sexual Activity   Alcohol use: No    Alcohol/week: 0.0 standard drinks of alcohol   Drug use: No   Sexual activity: Yes    Birth control/protection: Surgical, Post-menopausal  Other Topics Concern   Not on file  Social History Narrative   Caliana grew up in [place]. She is  married and lives in [place] with her husband. She works as a social worker. She and her husband are raising their nephew that is 18 years old. She has 2 adult children from a previous marriage. Her children live in [place]. She enjoys being outdoors. She also enjoys relaxing and watching TV.   Social Drivers of Health   Tobacco Use: Low Risk (06/13/2024)   Patient History    Smoking Tobacco Use: Never    Smokeless Tobacco Use: Never    Passive Exposure: Not on file  Financial Resource Strain: Low Risk (06/13/2024)   Overall Financial Resource Strain (CARDIA)    Difficulty of Paying Living Expenses: Not hard at all  Recent Concern: Financial Resource Strain - Medium Risk (03/27/2024)   Received from [facility]   Overall Financial Resource Strain (CARDIA)    Difficulty of Paying Living Expenses: Somewhat hard  Food Insecurity: No Food Insecurity (06/13/2024)   Epic    Worried About Running Out of Food in the Last Year: Never true    Ran Out of Food in the Last Year: Never true  Transportation Needs: No Transportation Needs (06/13/2024)   Epic    Lack of Transportation (Medical): No    Lack of Transportation (Non-Medical): No  Physical Activity: Sufficiently Active (06/13/2024)   Exercise Vital Sign    Days of Exercise per Week: 3 days    Minutes of Exercise per Session: 60 min  Stress: No Stress Concern Present (06/13/2024)   [employer] of Occupational Health - Occupational Stress Questionnaire    Feeling of Stress: Not at all  Social Connections: Moderately Integrated (06/13/2024)   Social Connection and Isolation Panel    Frequency of Communication with Friends and Family: Three times a week    Frequency of Social Gatherings with Friends and Family: Three times a week  Attends Religious Services: More than 4 times per year    Active Member of Clubs or Organizations: No    Attends Banker Meetings: Never    Marital Status: Married  Careers Information Officer Violence: Not At Risk (06/13/2024)   Epic    Fear of Current or Ex-Partner: No    Emotionally Abused: No    Physically Abused: No    Sexually Abused: No  Depression (PHQ2-9): Low Risk (06/13/2024)   Depression (PHQ2-9)    PHQ-2 Score: 0  Alcohol Screen: Low Risk (06/13/2024)   Alcohol Screen    Last Alcohol Screening Score (AUDIT): 0  Housing: Unknown (06/13/2024)   Epic    Unable to Pay for Housing in the Last Year: No    Number of Times Moved in the Last Year: Not on file    Homeless in the Last Year: No  Utilities: Not At Risk (06/13/2024)   Epic    Threatened with loss of utilities: No  Health Literacy: Adequate Health Literacy (06/13/2024)   B1300 Health Literacy    Frequency of need for help with medical instructions: Never    Current Medications[1]  Allergies[2]   ROS  Constitutional: Negative for fever or weight change.  Respiratory: Negative for cough and shortness of breath.   Cardiovascular: Negative for chest pain or palpitations.  Gastrointestinal: Negative for abdominal pain, no bowel changes.  Musculoskeletal: Negative for gait problem or joint swelling.  Skin: Negative for rash.  Neurological: Negative for dizziness or headache.  No other specific complaints in a complete review of systems (except as listed in HPI above).   Objective  Vitals:   06/13/24 0951  BP: 116/86  Pulse: 84  Temp: 98 F (36.7 C)  SpO2: 98%  Weight: 180 lb (81.6 kg)  Height: 5' 2 (1.575 m)    Body mass index is 32.92 kg/m.  Physical Exam Vitals reviewed.  Constitutional:      Appearance: Normal appearance.  HENT:     Head: Normocephalic.     Right Ear: Tympanic membrane normal.     Left Ear: Tympanic membrane normal.     Nose: Nose normal.  Eyes:     Extraocular Movements: Extraocular movements intact.     Conjunctiva/sclera: Conjunctivae normal.     Pupils: Pupils are equal, round, and reactive to light.  Neck:     Thyroid : No thyroid  mass, thyromegaly  or thyroid  tenderness.  Cardiovascular:     Rate and Rhythm: Normal rate and regular rhythm.     Pulses: Normal pulses.     Heart sounds: Normal heart sounds.  Pulmonary:     Effort: Pulmonary effort is normal.     Breath sounds: Normal breath sounds.  Abdominal:     General: Bowel sounds are normal.     Palpations: Abdomen is soft.  Musculoskeletal:        General: Normal range of motion.     Cervical back: Normal range of motion and neck supple.     Right lower leg: No edema.     Left lower leg: No edema.  Skin:    General: Skin is warm and dry.     Capillary Refill: Capillary refill takes less than 2 seconds.  Neurological:     General: No focal deficit present.     Mental Status: She is alert and oriented to person, place, and time. Mental status is at baseline.  Psychiatric:        Mood and Affect: Mood normal.  Behavior: Behavior normal.        Thought Content: Thought content normal.        Judgment: Judgment normal.      No results found for this or any previous visit (from the past 2160 hours).   Fall Risk:    06/07/2023    2:30 PM 11/30/2022    2:46 PM 01/12/2022    1:32 PM 07/14/2021    2:02 PM 02/24/2021    1:09 PM  Fall Risk   Falls in the past year? 0 0 0 0 0  Number falls in past yr: 0 0 0 0 0  Injury with Fall? 0  0  0  0  0   Risk for fall due to : Medication side effect      Follow up Falls evaluation completed  Falls evaluation completed  Falls evaluation completed  Falls evaluation completed      Data saved with a previous flowsheet row definition     Functional Status Survey: Is the patient deaf or have difficulty hearing?: No Does the patient have difficulty seeing, even when wearing glasses/contacts?: No Does the patient have difficulty concentrating, remembering, or making decisions?: No Does the patient have difficulty walking or climbing stairs?: No Does the patient have difficulty dressing or bathing?: No Does the patient have  difficulty doing errands alone such as visiting a doctor's office or shopping?: No   Assessment & Plan  Problem List Items Addressed This Visit       Other   Class 1 obesity without serious comorbidity with body mass index (BMI) of 33.0 to 33.9 in adult   Relevant Medications   phentermine  (ADIPEX-P ) 37.5 MG tablet   History of abnormal TSH   Relevant Orders   Thyroid  Panel With TSH   Hyperlipidemia   Relevant Orders   Lipid panel   Vertigo   Relevant Medications   meclizine  (ANTIVERT ) 25 MG tablet   Other Visit Diagnoses       Annual physical exam    -  Primary   Relevant Orders   CBC with Differential/Platelet   Comprehensive metabolic panel with GFR   Thyroid  Panel With TSH   Hemoglobin A1c   Iron, TIBC and Ferritin Panel   Lipid panel   MM 3D SCREENING MAMMOGRAM BILATERAL BREAST     Encounter for screening mammogram for malignant neoplasm of breast       Relevant Orders   MM 3D SCREENING MAMMOGRAM BILATERAL BREAST     Iron deficiency       Relevant Orders   CBC with Differential/Platelet   Iron, TIBC and Ferritin Panel     Prediabetes       Relevant Orders   Comprehensive metabolic panel with GFR   Hemoglobin A1c     Screening for deficiency anemia       Relevant Orders   CBC with Differential/Platelet      Assessment and Plan Assessment & Plan Class 1 obesity BMI of 32.92. She is interested in pharmacological intervention for weight loss and has previously used phentermine  with success. She is also working on lifestyle modifications, including dietary changes and exercise. - Prescribed phentermine , instructed to start with half a tablet for the first few days to avoid shakiness. - Advised to take phentermine  in the morning. - Encouraged a calorie deficit diet aiming for 1600-1800 calories per day. - Recommended regular physical activity, including strength training. - Scheduled follow-up in 3 months to assess progress.  Hyperlipidemia - Ordered  cholesterol panel.  Iron deficiency Previous improvement but still low iron levels. She is on iron supplementation. - Ordered iron panel.  Abnormal TSH Previous TSH level of 0.38. She did not return for follow-up as previously advised. - Ordered full thyroid  panel.  Prediabetes Previous A1c of 5.6, indicating improvement from past prediabetic levels. - Ordered A1c test.  Vertigo Managed with meclizine , which she uses occasionally. - Prescribed meclizine .  General Health Maintenance She plans to receive the shingles vaccine at a nurse's visit. She has not had a dental exam in 1.5 to 2 years and has not had an eye exam in several years. She is sexually active and has no issues with violence at home. - Plan for shingles vaccine at nurse's visit. - Schedule dental exam. - Schedule eye exam.     -USPSTF grade A and B recommendations reviewed with patient; age-appropriate recommendations, preventive care, screening tests, etc discussed and encouraged; healthy living encouraged; see AVS for patient education given to patient -Discussed importance of 150 minutes of physical activity weekly, eat two servings of fish weekly, eat one serving of tree nuts ( cashews, pistachios, pecans, almonds.SABRA) every other day, eat 6 servings of fruit/vegetables daily and drink plenty of water and avoid sweet beverages.   -Reviewed Health Maintenance: yes     [1]  Current Outpatient Medications:    Cholecalciferol (VITAMIN D3) 50 MCG (2000 UT) capsule, Take 1 capsule (2,000 Units total) by mouth daily., Disp: 90 capsule, Rfl: 3   phentermine  (ADIPEX-P ) 37.5 MG tablet, Take 1 tablet (37.5 mg total) by mouth daily before breakfast., Disp: 30 tablet, Rfl: 2   meclizine  (ANTIVERT ) 25 MG tablet, Take 1 tablet (25 mg total) by mouth 3 (three) times daily as needed for dizziness., Disp: 21 tablet, Rfl: 0 [2] No Known Allergies

## 2024-06-14 ENCOUNTER — Ambulatory Visit: Payer: Self-pay | Admitting: Nurse Practitioner

## 2024-06-14 LAB — COMPREHENSIVE METABOLIC PANEL WITH GFR
AG Ratio: 1.6 (calc) (ref 1.0–2.5)
ALT: 15 U/L (ref 6–29)
AST: 16 U/L (ref 10–35)
Albumin: 4.2 g/dL (ref 3.6–5.1)
Alkaline phosphatase (APISO): 79 U/L (ref 37–153)
BUN: 10 mg/dL (ref 7–25)
CO2: 28 mmol/L (ref 20–32)
Calcium: 9.2 mg/dL (ref 8.6–10.4)
Chloride: 106 mmol/L (ref 98–110)
Creat: 0.57 mg/dL (ref 0.50–1.05)
Globulin: 2.6 g/dL (ref 1.9–3.7)
Glucose, Bld: 90 mg/dL (ref 65–99)
Potassium: 4.1 mmol/L (ref 3.5–5.3)
Sodium: 140 mmol/L (ref 135–146)
Total Bilirubin: 0.6 mg/dL (ref 0.2–1.2)
Total Protein: 6.8 g/dL (ref 6.1–8.1)
eGFR: 104 mL/min/{1.73_m2}

## 2024-06-14 LAB — CBC WITH DIFFERENTIAL/PLATELET
Absolute Lymphocytes: 2204 {cells}/uL (ref 850–3900)
Absolute Monocytes: 390 {cells}/uL (ref 200–950)
Basophils Absolute: 28 {cells}/uL (ref 0–200)
Basophils Relative: 0.6 %
Eosinophils Absolute: 150 {cells}/uL (ref 15–500)
Eosinophils Relative: 3.2 %
HCT: 39.2 % (ref 35.9–46.0)
Hemoglobin: 12.5 g/dL (ref 11.7–15.5)
MCH: 26.9 pg — ABNORMAL LOW (ref 27.0–33.0)
MCHC: 31.9 g/dL (ref 31.6–35.4)
MCV: 84.5 fL (ref 81.4–101.7)
MPV: 10.4 fL (ref 7.5–12.5)
Monocytes Relative: 8.3 %
Neutro Abs: 1927 {cells}/uL (ref 1500–7800)
Neutrophils Relative %: 41 %
Platelets: 227 10*3/uL (ref 140–400)
RBC: 4.64 Million/uL (ref 3.80–5.10)
RDW: 14.8 % (ref 11.0–15.0)
Total Lymphocyte: 46.9 %
WBC: 4.7 10*3/uL (ref 3.8–10.8)

## 2024-06-14 LAB — HEMOGLOBIN A1C
Hgb A1c MFr Bld: 5.9 % — ABNORMAL HIGH
Mean Plasma Glucose: 123 mg/dL
eAG (mmol/L): 6.8 mmol/L

## 2024-06-14 LAB — LIPID PANEL
Cholesterol: 196 mg/dL
HDL: 81 mg/dL
LDL Cholesterol (Calc): 100 mg/dL — ABNORMAL HIGH
Non-HDL Cholesterol (Calc): 115 mg/dL
Total CHOL/HDL Ratio: 2.4 (calc)
Triglycerides: 64 mg/dL

## 2024-06-14 LAB — THYROID PANEL WITH TSH
Free Thyroxine Index: 2 (ref 1.4–3.8)
T3 Uptake: 28 % (ref 22–35)
T4, Total: 7.1 ug/dL (ref 5.1–11.9)
TSH: 0.76 m[IU]/L (ref 0.40–4.50)

## 2024-06-14 LAB — IRON,TIBC AND FERRITIN PANEL
%SAT: 26 % (ref 16–45)
Ferritin: 38 ng/mL (ref 16–232)
Iron: 71 ug/dL (ref 45–160)
TIBC: 273 ug/dL (ref 250–450)

## 2024-07-10 ENCOUNTER — Encounter

## 2024-09-04 ENCOUNTER — Ambulatory Visit: Admitting: Nurse Practitioner
